# Patient Record
Sex: Female | Born: 1945 | Race: White | Hispanic: No | Marital: Married | Smoking: Former smoker
Health system: Southern US, Community
[De-identification: ages and names within clinical notes are randomized; demographics above are authoritative.]

## PROBLEM LIST (undated history)

## (undated) ENCOUNTER — Ambulatory Visit

## (undated) DIAGNOSIS — J45901 Unspecified asthma with (acute) exacerbation: Secondary | ICD-10-CM

## (undated) DIAGNOSIS — R519 Headache, unspecified: Secondary | ICD-10-CM

## (undated) DIAGNOSIS — J349 Unspecified disorder of nose and nasal sinuses: Secondary | ICD-10-CM

## (undated) DIAGNOSIS — Z972 Presence of dental prosthetic device (complete) (partial): Secondary | ICD-10-CM

## (undated) DIAGNOSIS — I10 Essential (primary) hypertension: Secondary | ICD-10-CM

## (undated) DIAGNOSIS — J449 Chronic obstructive pulmonary disease, unspecified: Secondary | ICD-10-CM

## (undated) DIAGNOSIS — K759 Inflammatory liver disease, unspecified: Secondary | ICD-10-CM

## (undated) DIAGNOSIS — R51 Headache: Secondary | ICD-10-CM

## (undated) HISTORY — PX: TRIGGER FINGER RELEASE: SHX641

## (undated) HISTORY — PX: HEMORRHOID SURGERY: SHX153

## (undated) HISTORY — DX: Headache, unspecified: R51.9

## (undated) HISTORY — DX: Unspecified asthma with (acute) exacerbation: J45.901

## (undated) HISTORY — PX: ABDOMINAL HYSTERECTOMY: SHX81

## (undated) HISTORY — PX: BREAST SURGERY: SHX581

## (undated) HISTORY — PX: BREAST EXCISIONAL BIOPSY: SUR124

## (undated) HISTORY — DX: Unspecified disorder of nose and nasal sinuses: J34.9

## (undated) HISTORY — PX: GALLBLADDER SURGERY: SHX652

## (undated) HISTORY — DX: Headache: R51

---

## 1994-03-11 HISTORY — PX: BREAST BIOPSY: SHX20

## 2005-12-06 ENCOUNTER — Ambulatory Visit: Payer: Self-pay

## 2006-01-09 HISTORY — PX: DEEP NECK LYMPH NODE BIOPSY / EXCISION: SUR126

## 2006-01-23 ENCOUNTER — Other Ambulatory Visit: Payer: Self-pay

## 2006-02-05 ENCOUNTER — Ambulatory Visit: Payer: Self-pay | Admitting: Otolaryngology

## 2009-01-02 ENCOUNTER — Ambulatory Visit: Payer: Self-pay | Admitting: Otolaryngology

## 2009-11-21 ENCOUNTER — Ambulatory Visit: Payer: Self-pay | Admitting: Internal Medicine

## 2010-07-12 ENCOUNTER — Emergency Department: Payer: Self-pay | Admitting: Emergency Medicine

## 2010-12-26 ENCOUNTER — Ambulatory Visit: Payer: Self-pay

## 2011-02-08 ENCOUNTER — Ambulatory Visit: Payer: Self-pay | Admitting: Internal Medicine

## 2012-01-08 ENCOUNTER — Ambulatory Visit: Payer: Self-pay | Admitting: Internal Medicine

## 2013-01-12 ENCOUNTER — Ambulatory Visit: Payer: Self-pay | Admitting: Internal Medicine

## 2013-02-11 ENCOUNTER — Encounter: Payer: Self-pay | Admitting: Podiatry

## 2013-02-12 ENCOUNTER — Ambulatory Visit (INDEPENDENT_AMBULATORY_CARE_PROVIDER_SITE_OTHER): Payer: Medicare Other

## 2013-02-12 ENCOUNTER — Ambulatory Visit (INDEPENDENT_AMBULATORY_CARE_PROVIDER_SITE_OTHER): Payer: Medicare Other | Admitting: Podiatry

## 2013-02-12 ENCOUNTER — Encounter: Payer: Self-pay | Admitting: Podiatry

## 2013-02-12 VITALS — BP 156/83 | HR 62 | Resp 16 | Ht 64.0 in | Wt 198.0 lb

## 2013-02-12 DIAGNOSIS — M79609 Pain in unspecified limb: Secondary | ICD-10-CM

## 2013-02-12 DIAGNOSIS — M79671 Pain in right foot: Secondary | ICD-10-CM

## 2013-02-12 DIAGNOSIS — G589 Mononeuropathy, unspecified: Secondary | ICD-10-CM

## 2013-02-12 DIAGNOSIS — M7750 Other enthesopathy of unspecified foot: Secondary | ICD-10-CM

## 2013-02-12 MED ORDER — TRIAMCINOLONE ACETONIDE 10 MG/ML IJ SUSP
10.0000 mg | Freq: Once | INTRAMUSCULAR | Status: AC
  Administered 2013-02-12: 10 mg

## 2013-02-13 NOTE — Progress Notes (Signed)
Subjective:     Patient ID: Caitlin House, female   DOB: 1945/06/05, 67 y.o.   MRN: [id number]  HPI patient presents with pain along the medial side of the big toe right stating it's no longer the nail but more towards my bunion site that is sore   Review of Systems     Objective:   Physical Exam Neurovascular status intact with discomfort distal to the first metatarsal right on the medial side with what appears to be inflammation and nerve irritation. 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 Today I did a careful injection along the medial side of the right big toe and discussed that we may need to remove this nerve and possible remove bone spur and correct bunion if symptoms do not improve over the next 6 weeks. Reappoint her recheck

## 2013-03-19 ENCOUNTER — Ambulatory Visit: Payer: Medicare Other | Admitting: Podiatry

## 2013-06-22 ENCOUNTER — Ambulatory Visit: Payer: Self-pay | Admitting: Podiatry

## 2013-07-09 ENCOUNTER — Ambulatory Visit (INDEPENDENT_AMBULATORY_CARE_PROVIDER_SITE_OTHER): Payer: Medicare Other | Admitting: Podiatry

## 2013-07-09 VITALS — Resp 16 | Ht 64.0 in | Wt 197.0 lb

## 2013-07-09 DIAGNOSIS — M779 Enthesopathy, unspecified: Secondary | ICD-10-CM

## 2013-07-09 DIAGNOSIS — M7750 Other enthesopathy of unspecified foot: Secondary | ICD-10-CM

## 2013-07-09 MED ORDER — TRIAMCINOLONE ACETONIDE 10 MG/ML IJ SUSP
10.0000 mg | Freq: Once | INTRAMUSCULAR | Status: AC
  Administered 2013-07-09: 10 mg

## 2013-07-11 NOTE — Progress Notes (Signed)
Subjective:     Patient ID: Caitlin MusterElizabeth House, female   DOB: May 29, 1945, 68 y.o.   MRN: [id number]  HPI patient presents stating it is getting tender again in its time for medication. It's been around 6 months   Review of Systems     Objective:   Physical Exam Neurovascular status intact with discomfort in the right sinus tarsi and also inflammation around the side of the right big toe with fluid buildup. Both areas are symptomatic with the ankle being worse    Assessment:     Capsulitis of the right sinus tarsi and tendinitis right foot around the first MPJ and distal    Plan:     Reviewed condition and injected the sinus tarsi right 3 mg Kenalog 5 of them site Marcaine mixture and carefully injected the medial side of the right hallux 3 mg Kenalog 5 mg I can Marcaine mixture reappoint as needed

## 2013-07-28 ENCOUNTER — Ambulatory Visit: Payer: Self-pay | Admitting: Gastroenterology

## 2013-11-12 ENCOUNTER — Encounter: Payer: Self-pay | Admitting: Podiatry

## 2013-11-12 ENCOUNTER — Ambulatory Visit (INDEPENDENT_AMBULATORY_CARE_PROVIDER_SITE_OTHER): Payer: Medicare Other | Admitting: Podiatry

## 2013-11-12 VITALS — BP 124/61 | HR 67 | Resp 16

## 2013-11-12 DIAGNOSIS — M779 Enthesopathy, unspecified: Secondary | ICD-10-CM

## 2013-11-12 DIAGNOSIS — M79609 Pain in unspecified limb: Secondary | ICD-10-CM

## 2013-11-12 DIAGNOSIS — M79671 Pain in right foot: Secondary | ICD-10-CM

## 2013-11-12 MED ORDER — TRIAMCINOLONE ACETONIDE 10 MG/ML IJ SUSP: 10.0000 mg | Freq: Once | INTRAMUSCULAR | Status: DC

## 2013-11-15 NOTE — Progress Notes (Signed)
Subjective:     Patient ID: Caitlin House, female   DOB: [birth year], 68 y.o.   MRN: [id number]  HPI patient states the pain is back in my right ankle and on the medial side of my right big toe joint is starting to become sore again   Review of Systems     Objective:   Physical Exam Neurovascular status is intact with discomfort in the right sinus tarsi and along the medial aspect of the right first MPJ extending to the hallux    Assessment:     Inflammatory capsulitis right along with inflammatory capsulitis right first MPJ    Plan:     Injected the right sinus tarsi 3 mg Kenalog 5 mg Xylocaine and the medial side of the right hallux and first MPJ 3 mg Kenalog 5 mg Xylocaine and instructed on returning when symptoms were to recur

## 2014-01-18 ENCOUNTER — Ambulatory Visit: Payer: Self-pay | Admitting: Internal Medicine

## 2014-02-01 ENCOUNTER — Ambulatory Visit (INDEPENDENT_AMBULATORY_CARE_PROVIDER_SITE_OTHER): Payer: Medicare Other | Admitting: Podiatry

## 2014-02-01 ENCOUNTER — Encounter: Payer: Self-pay | Admitting: Podiatry

## 2014-02-01 VITALS — BP 116/80 | HR 66 | Resp 16

## 2014-02-01 DIAGNOSIS — M779 Enthesopathy, unspecified: Secondary | ICD-10-CM

## 2014-02-01 MED ORDER — TRIAMCINOLONE ACETONIDE 10 MG/ML IJ SUSP
10.0000 mg | Freq: Once | INTRAMUSCULAR | Status: AC
  Administered 2014-02-01: 10 mg

## 2014-02-01 NOTE — Progress Notes (Signed)
Subjective:     Patient ID: Caitlin MusterElizabeth Hildenbrand, female   DOB: [birth year], 68 y.o.   MRN: [id number]  HPI patient states her right ankle has started to hurt her quite a bit and she needs an injection   Review of Systems     Objective:   Physical Exam Neurovascular status intact with sinus tarsi right inflamed but able to walk well with pain just occurring in the last couple weeks    Assessment:     Inflammatory capsulitis right    Plan:     Injected the sinus tarsi 3 mg Kenalog 5 mg Xylocaine and advised on ice therapy and possible orthotic usage for the future depending on how she responds

## 2014-02-21 ENCOUNTER — Ambulatory Visit: Payer: Self-pay | Admitting: Physician Assistant

## 2014-04-26 ENCOUNTER — Ambulatory Visit (INDEPENDENT_AMBULATORY_CARE_PROVIDER_SITE_OTHER): Payer: Medicare Other | Admitting: Podiatry

## 2014-04-26 VITALS — BP 126/65 | HR 66 | Resp 18

## 2014-04-26 DIAGNOSIS — M722 Plantar fascial fibromatosis: Secondary | ICD-10-CM

## 2014-04-27 NOTE — Progress Notes (Signed)
Subjective:     Patient ID: Caitlin House, female   DOB: [birth year], 69 y.o.   MRN: [id number]  HPI patient presents stating she noted a knot in her left arch and it has been mildly tender and she was concerned about   Review of Systems     Objective:   Physical Exam Neurovascular status intact with no other health history changes noted and noted to have a small nodule in the left plantar fashion mid arch area that's moderately tender when pressed and is localized with no proximal edema erythema noted. 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 Also we'll begin home hot compresses therapy and if any growth and size were to occur or changes then we will consider excision or MRI. At this time I offered MRI and she denies wanting it and wants to try this more conservative route

## 2014-06-23 ENCOUNTER — Ambulatory Visit (INDEPENDENT_AMBULATORY_CARE_PROVIDER_SITE_OTHER): Payer: Medicare Other

## 2014-06-23 ENCOUNTER — Ambulatory Visit (INDEPENDENT_AMBULATORY_CARE_PROVIDER_SITE_OTHER): Payer: Medicare Other | Admitting: Podiatry

## 2014-06-23 VITALS — BP 137/78 | HR 74 | Resp 16

## 2014-06-23 DIAGNOSIS — M79671 Pain in right foot: Secondary | ICD-10-CM

## 2014-06-23 DIAGNOSIS — M779 Enthesopathy, unspecified: Secondary | ICD-10-CM

## 2014-06-24 NOTE — Progress Notes (Signed)
Patient ID: Caitlin House, female   DOB: [birth year], 69 y.o.   MRN: [id number]  Subjective: [age]-year-old female presents today with complaints of pain in her right foot. She states that she appears injection this area last year with Dr. Charlsie Merlesegal which helped, and she is inquiring about another injection. She states that she has orthotics at home which she got for plantar fasciitis, but has not been wearing them. No recent injury or trauma. Denies any overlying redness/swelling. No numbness/tingling. No other complaints at this time. Denies any fevers, chills, nausea, vomiting. No acute changes since last appointment.   Objective: AAO x3, NAD DP/PT pulses palpable b/l, CRT < 3 sec Protective sensation intact with Simms Weinstein monofilament, Achilles tendon reflex intact. On the right side there is tenderness palpation upon lateral aspect of the sinus tarsi and there is decreased range of motion of the subtalar joint. There is no pain with range of motion subtalar joint and there is no crepitation. There is no pain with ankle joint range of motion or crepitation range of motion is intact. No other areas of tenderness to bilateral lower extremities. No overlying edema, erythema, increase in warmth. No open lesions or pre-ulcerative lesions. No pain with calf compression, swelling, warmth, erythema.  Assessment: 69 year old female pain along sinus tarsi, capsulitis  Plan: -X-rays were obtained and reviewed with the patient -Treatment options were discussed including alternatives, risks, complications -Patient is requesting steroid injection into the area. Risks and complications the injection were discussed with the patient. Patient verbally consented. Under sterile conditions a total 1.5 mL mixture of 2% lidocaine plain and dexamethasone phosphate was infiltrated into the lateral aspect of the sinus tarsi. Band-Aid was applied. Postinjection care was discussed the patient. -She has orthotics at  home and recommended herself wearing these to help control her foot type. Discussed with her that if she needs adjustment she can bring them to her next appointment. -Follow-up as needed. In the meantime encouraged to call the office with any questions or concerns.

## 2014-10-18 ENCOUNTER — Ambulatory Visit (INDEPENDENT_AMBULATORY_CARE_PROVIDER_SITE_OTHER): Payer: Medicare Other | Admitting: Podiatry

## 2014-10-18 VITALS — BP 121/73 | HR 73 | Resp 16

## 2014-10-18 DIAGNOSIS — M779 Enthesopathy, unspecified: Secondary | ICD-10-CM | POA: Diagnosis not present

## 2014-10-18 DIAGNOSIS — M19071 Primary osteoarthritis, right ankle and foot: Secondary | ICD-10-CM

## 2014-10-19 NOTE — Progress Notes (Signed)
Patient ID: Caitlin House, female   DOB: 12-22-1945, 69 y.o.   MRN: [id number]  Subjective: 69 year old female presents the office they for follow-up evaluation continued pain of right foot pain. She states that she gets relief after the injections which she has had to her sinus tarsi however the pain does recur. She does state the injections help however the pain does come back. She states that he only has pain to the area after standing for long periods of time at work. Other than that she did not have any pain with regular activity. She denies any history of injury or trauma. Denies any swelling or redness. No tenderness. No other complaints at this time in no acute changes since last appointment.  Objective: AAO 3, NAD DP/PT pulses palpable, CRT less than 3 seconds Protective sensation intact with Simms Weinstein monofilament Subjectively there is tenderness to palpation on the lateral aspect of the sinus tarsi when she does have pain however she doesn't have pain at this time. There is mild decrease in range of motion of subtalar joint without any crepitation. There is no pain with range of motion however. There is no pain with ankle joint range of motion. There is no area of pinpoint tenderness or pain the vibratory sensation. There is no overlying edema, erythema, increase in warmth. No open lesions or pre-ulcer lesions. No pain with calf compression, swelling, warmth, erythema.  Assessment: 69 year old female with continue sinus tarsi pain with activity  Plan: -Treatment options discussed including all alternatives, risks, and complications -I do that she would likely benefit from an AFO to help stabilize her ankle/subtalar joint. Rx AFO for [facility].  -Follow-up after AFO or sooner if any problems arise. In the meantime, encouraged to call the office with any questions, concerns, change in symptoms.  -Will hold off on another injection at this time as it is not that  painful.  Ovid Curd, DPM

## 2014-10-20 ENCOUNTER — Ambulatory Visit: Payer: Medicare Other

## 2014-11-17 ENCOUNTER — Ambulatory Visit: Payer: Medicare Other

## 2014-11-17 DIAGNOSIS — M25571 Pain in right ankle and joints of right foot: Secondary | ICD-10-CM | POA: Diagnosis not present

## 2014-11-17 DIAGNOSIS — M19072 Primary osteoarthritis, left ankle and foot: Secondary | ICD-10-CM | POA: Diagnosis not present

## 2014-11-22 ENCOUNTER — Other Ambulatory Visit: Payer: Self-pay | Admitting: Internal Medicine

## 2014-11-22 DIAGNOSIS — Z1231 Encounter for screening mammogram for malignant neoplasm of breast: Secondary | ICD-10-CM

## 2014-12-29 ENCOUNTER — Encounter: Payer: Self-pay | Admitting: Podiatry

## 2014-12-29 ENCOUNTER — Ambulatory Visit (INDEPENDENT_AMBULATORY_CARE_PROVIDER_SITE_OTHER): Payer: Medicare Other

## 2014-12-29 ENCOUNTER — Ambulatory Visit (INDEPENDENT_AMBULATORY_CARE_PROVIDER_SITE_OTHER): Payer: Medicare Other | Admitting: Podiatry

## 2014-12-29 VITALS — BP 174/89 | HR 72 | Resp 18

## 2014-12-29 DIAGNOSIS — R52 Pain, unspecified: Secondary | ICD-10-CM

## 2014-12-29 DIAGNOSIS — M19071 Primary osteoarthritis, right ankle and foot: Secondary | ICD-10-CM | POA: Diagnosis not present

## 2014-12-29 DIAGNOSIS — M795 Residual foreign body in soft tissue: Secondary | ICD-10-CM | POA: Diagnosis not present

## 2014-12-29 NOTE — Progress Notes (Signed)
Patient ID: Caitlin House, female   DOB: 13-Nov-1945, 69 y.o.   MRN: [id number]  Subjective: Patient presents the office concerns of possible glass the bottom of her right foot. She states that last weekend a glass door broke and she later stepped on something in the kitchen for which she thinks may be glass. They have tried to take the area himself call however were unsuccessful she continues to have pain to her right foot. She denies any surrounding redness or drainage from along the puncture site. Denies any swelling. She has a states that she has been wearing the braces last appointment although it did wear out her shoes that she was wearing. She states the brace is comfortable when she was wearing it however she is also fitted into her current shoes at this time. She is when wearing the ankle brace which seems to help as well. No other complaints at this time in no acute changes.  Objective: AAO 3, NAD DP/PT pulses 2/4, CRT less than 3 seconds Protective sensation intact with Simms Weinstein monofilament At this time there is no specific tenderness along the lateral aspect of the sinus tarsi. There is a decreased range of motion of the subtalar joint without any crepitation. No pain with ankle joint range of motion.  On the right foot submetatarsal one that does appear to be a small hyperkeratotic lesion with what appears to be puncture site within the central aspect and some dried blood. Upon debridement of the hyperkeratotic tissue a small piece of glass was removed from the patient. Upon removal of the glass do not appear to be any other foreign object identified. There is no swelling erythema, ascending cellulitis, fluctuance, crepitus, malodor, drainage/purulence. There is no other areas of tenderness to bilateral lower extremity is. No other open lesions or pre-ulcerative lesions. No pain with calf compression, swelling, warmth, erythema.  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  -Hyperkeratotic lesion on the right foot was debrided in a small foreign body which is consistent with glass was removed. And about equinus place over the area after the area was cleansed. Recommended he continue daily dressing changes. The areas nonhealing the next 1-2 weeks to call the office or sooner if any problems are to arise. Monitor for any clinical signs or symptoms of infection and directed to call the office immediately should any occur or go to the ER. -Continue with bracing. -Follow-up next 2 weeks of the areas not healed at there is continued pain. In the meantime call the office with any questions, concerns, change in symptoms.  Ovid CurdMatthew Otila Starn, DPM

## 2015-01-24 ENCOUNTER — Ambulatory Visit
Admission: RE | Admit: 2015-01-24 | Discharge: 2015-01-24 | Disposition: A | Discharge status: Home / Self Care | Payer: Medicare Other | Source: Ambulatory Visit | Attending: Internal Medicine | Admitting: Internal Medicine

## 2015-01-24 DIAGNOSIS — Z1231 Encounter for screening mammogram for malignant neoplasm of breast: Secondary | ICD-10-CM | POA: Insufficient documentation

## 2015-06-24 ENCOUNTER — Encounter: Payer: Self-pay | Admitting: Emergency Medicine

## 2015-06-24 ENCOUNTER — Ambulatory Visit
Admission: EM | Admit: 2015-06-24 | Discharge: 2015-06-24 | Disposition: A | Discharge status: Home / Self Care | Payer: Medicare Other | Attending: Family Medicine | Admitting: Family Medicine

## 2015-06-24 DIAGNOSIS — S39012A Strain of muscle, fascia and tendon of lower back, initial encounter: Secondary | ICD-10-CM | POA: Diagnosis not present

## 2015-06-24 MED ORDER — CYCLOBENZAPRINE HCL 5 MG PO TABS: 5.0000 mg | ORAL_TABLET | Freq: Every day | ORAL | Status: DC

## 2015-06-24 MED ORDER — NAPROXEN 500 MG PO TABS: 500.0000 mg | ORAL_TABLET | Freq: Two times a day (BID) | ORAL | Status: DC

## 2015-06-24 NOTE — ED Notes (Signed)
[place] back catch.  It has gotten worse today

## 2015-06-24 NOTE — ED Provider Notes (Signed)
CSN: [id number]     Arrival date & time 06/24/15  1342 History   First MD Initiated Contact with Patient 06/24/15 1444     Chief Complaint  Patient presents with  . Back Pain   (Consider location/radiation/quality/duration/timing/severity/associated sxs/prior Treatment) HPI: Patient presents today with symptoms of lower back pain. Patient states that she was walking getting into [employer] when she stepped funny and felt her back started to hurt. Patient denies any chronic issues with her back. She denies any radicular symptoms, incontinence, fever, chills, urinary symptoms, foot drop.  Past Medical History  Diagnosis Date  . Sinus problem   . Asthma flare   . Frequent headaches    Past Surgical History  Procedure Laterality Date  . Abdominal hysterectomy    . Gallbladder surgery    . Hemorrhoid surgery    . Breast surgery Right   . Trigger finger release Right   . Breast biopsy Right 1996    neg   Family History  Problem Relation Age of Onset  . Cancer Father   . Breast cancer Maternal Aunt    Social History  Substance Use Topics  . Smoking status: Former Games developermoker  . Smokeless tobacco: Never Used  . Alcohol Use: Yes     Comment: rarely   OB History    No data available     Review of Systems: Negative except mentioned above.   Allergies  Sulfa antibiotics  Home Medications   Prior to Admission medications   Medication Sig Start Date End Date Taking? Authorizing Provider  atenolol (TENORMIN) 100 MG tablet Take 100 mg by mouth daily.   Yes Historical Provider, MD  butalbital-aspirin-caffeine [facility](FIORINAL) 50-325-40 MG tablet Take 1 tablet by mouth 2 (two) times daily as needed for headache.   Yes Historical Provider, MD  losartan (COZAAR) 50 MG tablet Take 50 mg by mouth daily.   Yes Historical Provider, MD  mometasone (NASONEX) 50 MCG/ACT nasal spray Place 2 sprays into the nose daily.   Yes Historical Provider, MD  pravastatin (PRAVACHOL) 20 MG tablet Take 20 mg by  mouth daily.   Yes Historical Provider, MD  bisoprolol-hydrochlorothiazide (ZIAC) 5-6.25 MG per tablet Take 1 tablet by mouth daily.    Historical Provider, MD  cyclobenzaprine (FLEXERIL) 5 MG tablet Take 1 tablet (5 mg total) by mouth at bedtime. Can cause drowsiness. 06/24/15   Jolene ProvostKirtida Shalinda Burkholder, MD  Fluticasone-Salmeterol (ADVAIR) 100-50 MCG/DOSE AEPB Inhale 1 puff into the lungs 2 (two) times daily.    Historical Provider, MD  hydrochlorothiazide (HYDRODIURIL) 25 MG tablet Take 25 mg by mouth daily.    Historical Provider, MD  naproxen (NAPROSYN) 500 MG tablet Take 1 tablet (500 mg total) by mouth 2 (two) times daily. Take with food. 06/24/15   Jolene ProvostKirtida Elmar [place], MD   Meds Ordered and Administered this Visit  Medications - No data to display  BP 126/68 mmHg  Pulse 73  Temp(Src) 97.9 F (36.6 C) (Tympanic)  Resp 18  Ht 5\' 2"  (1.575 m)  Wt 191 lb (86.637 kg)  BMI 34.93 kg/m2  SpO2 99% No data found.   Physical Exam  GENERAL: NAD HEENT: no pharyngeal erythema, no exudate, no erythema of TMs, no cervical LAD RESP: CTA B CARD: RRR MSK: no midline tenderness, generalized lower back tenderness bilaterally L>R, FROM, -SLR, 5/5 strength of LEs, nv intact  NEURO: CN II-XII grossly intact   ED Course  Procedures (including critical care time)  Labs Review Labs Reviewed - No data to display  Imaging Review No results found.    MDM   1. Back strain, initial encounter   Will treat patient with Naproxen and Flexeril, ice/heat when necessary, if symptoms do persist or worsen I do recommend that patient have imaging of her back. Patient addresses understanding of plan and will seek medical attention if needed.    Jolene Provost, MD 06/24/15 [phone]

## 2015-12-25 ENCOUNTER — Other Ambulatory Visit: Payer: Self-pay | Admitting: Internal Medicine

## 2015-12-25 DIAGNOSIS — Z1231 Encounter for screening mammogram for malignant neoplasm of breast: Secondary | ICD-10-CM

## 2016-01-29 ENCOUNTER — Ambulatory Visit
Admission: RE | Admit: 2016-01-29 | Discharge: 2016-01-29 | Disposition: A | Discharge status: Home / Self Care | Payer: Medicare Other | Source: Ambulatory Visit | Attending: Internal Medicine | Admitting: Internal Medicine

## 2016-01-29 DIAGNOSIS — Z1231 Encounter for screening mammogram for malignant neoplasm of breast: Secondary | ICD-10-CM | POA: Diagnosis not present

## 2016-04-12 IMAGING — MG MM DIGITAL SCREENING BILAT W/ CAD
1 series · 4 of 4 positions shown · non-contrast
Comparison: Previous exam(s).

CLINICAL DATA: Screening. Prior benign right breast biopsy in 6009.
Family history of breast cancer in an aunt

EXAM:
DIGITAL SCREENING BILATERAL MAMMOGRAM WITH CAD

[R CC · right · 4 of 4 slices shown]
[im 1/4]
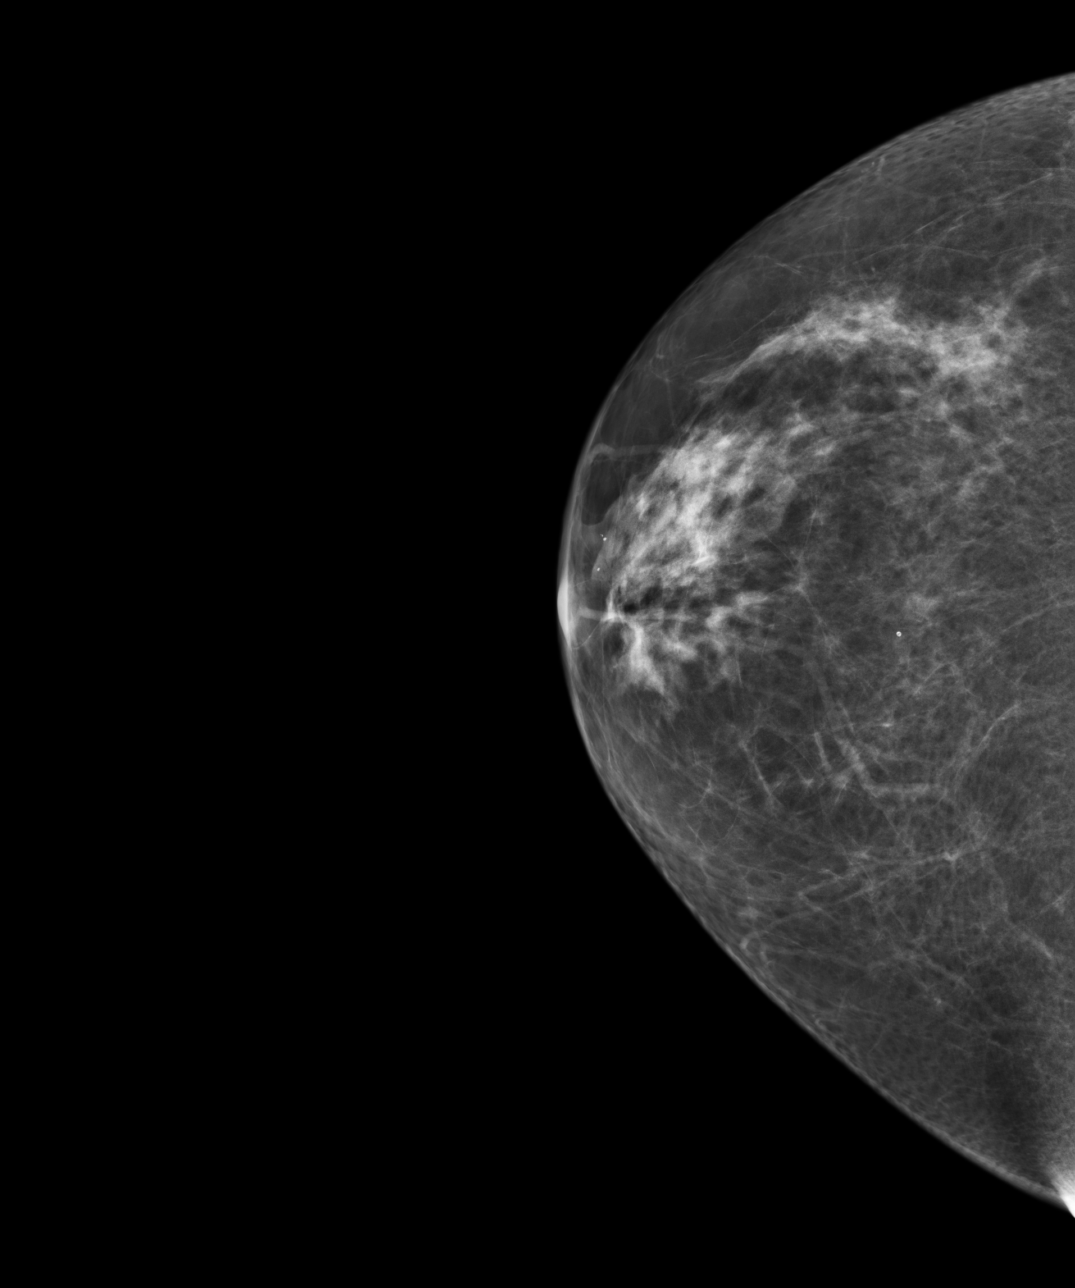
[im 2/4]
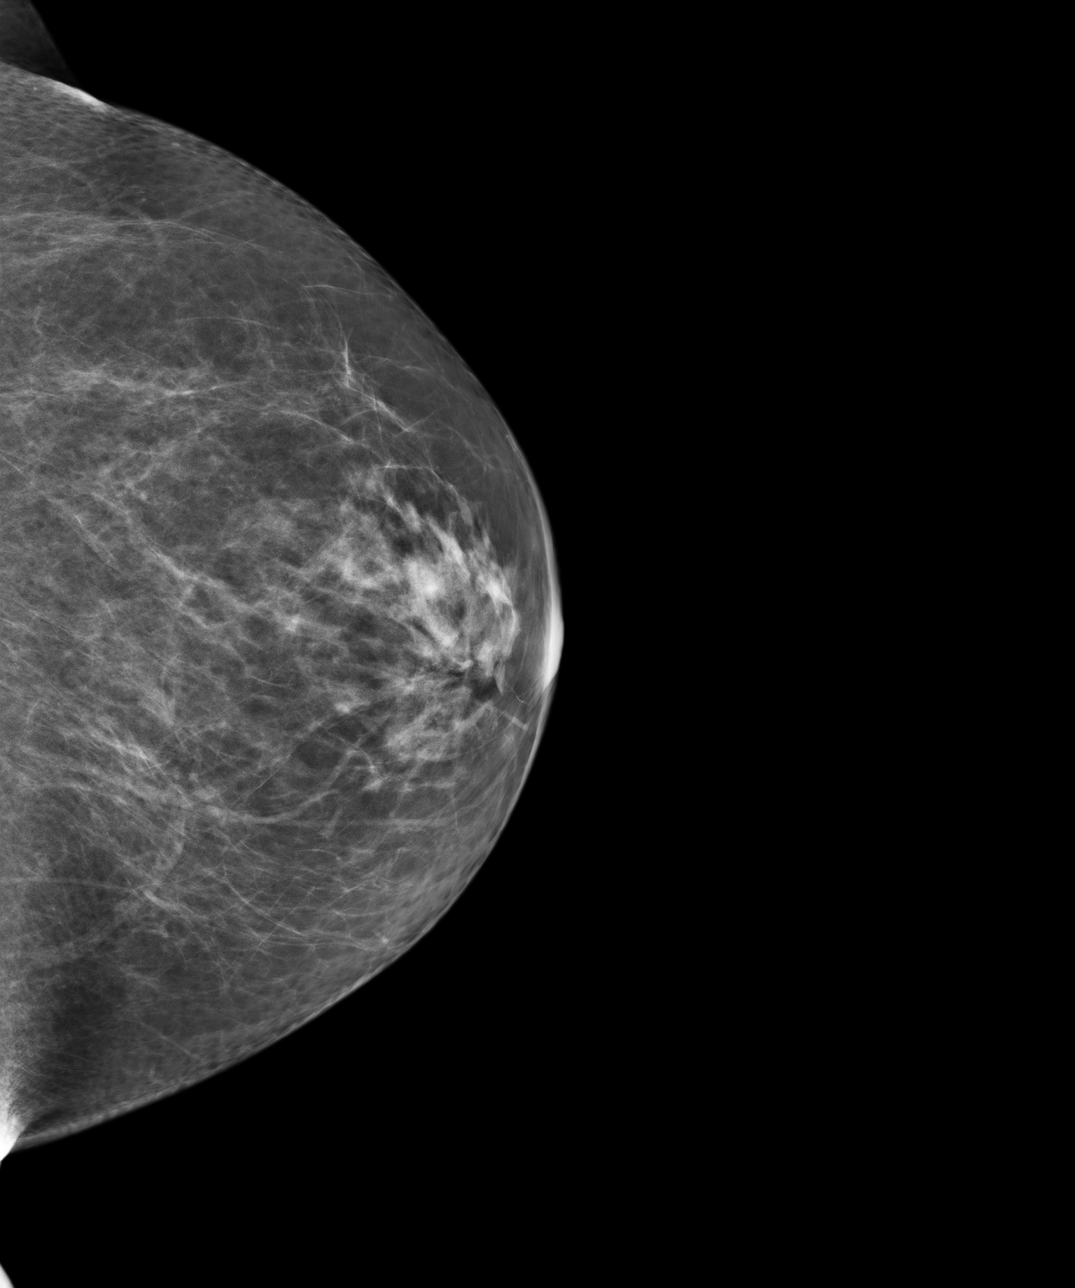
[im 3/4]
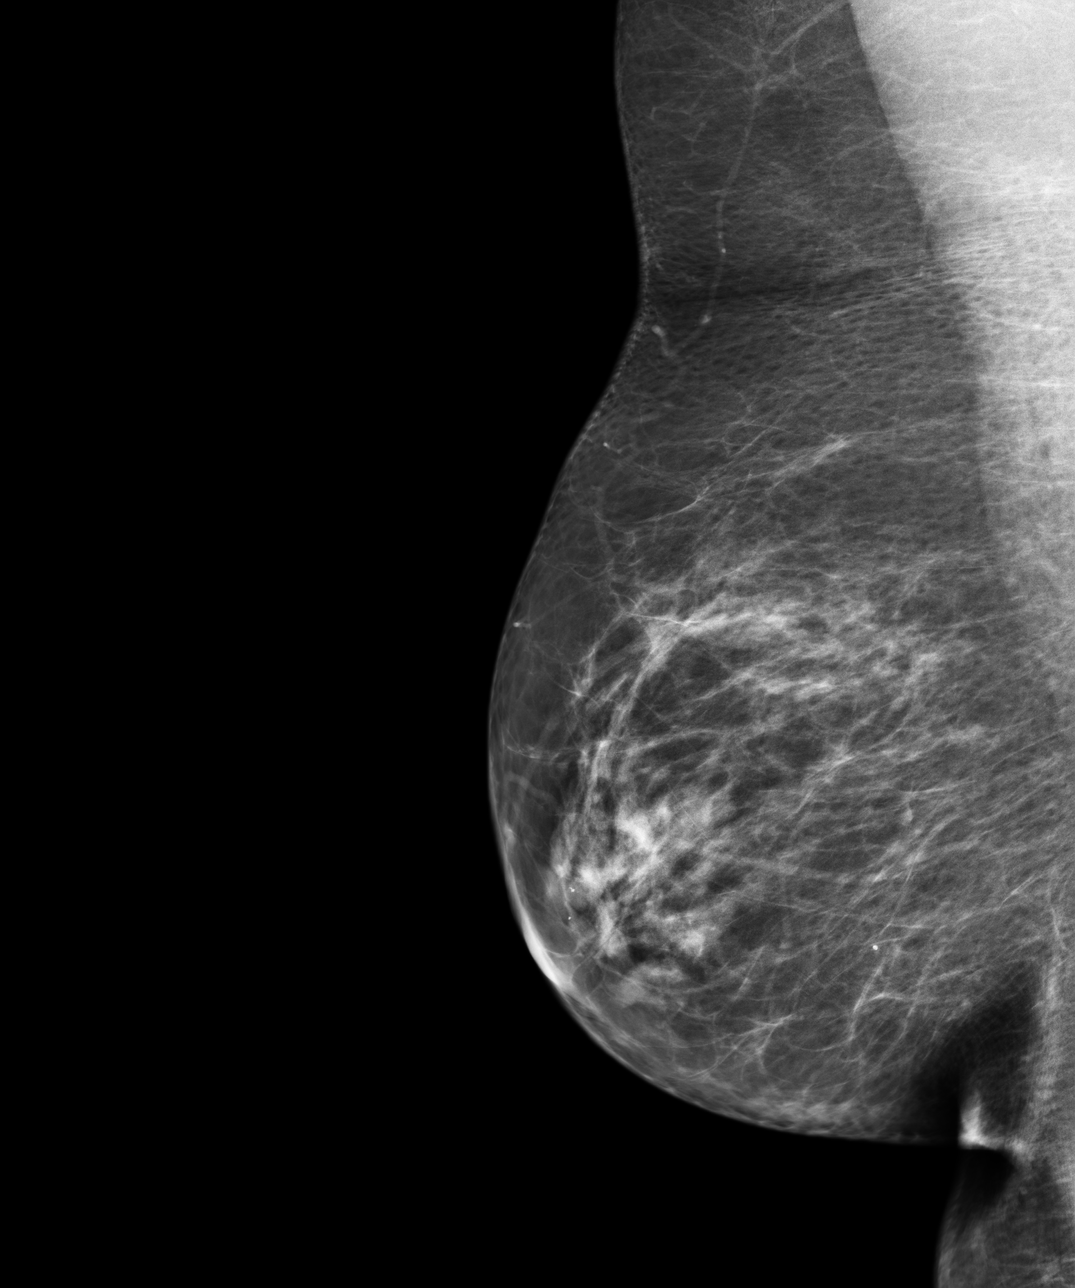
[im 4/4]
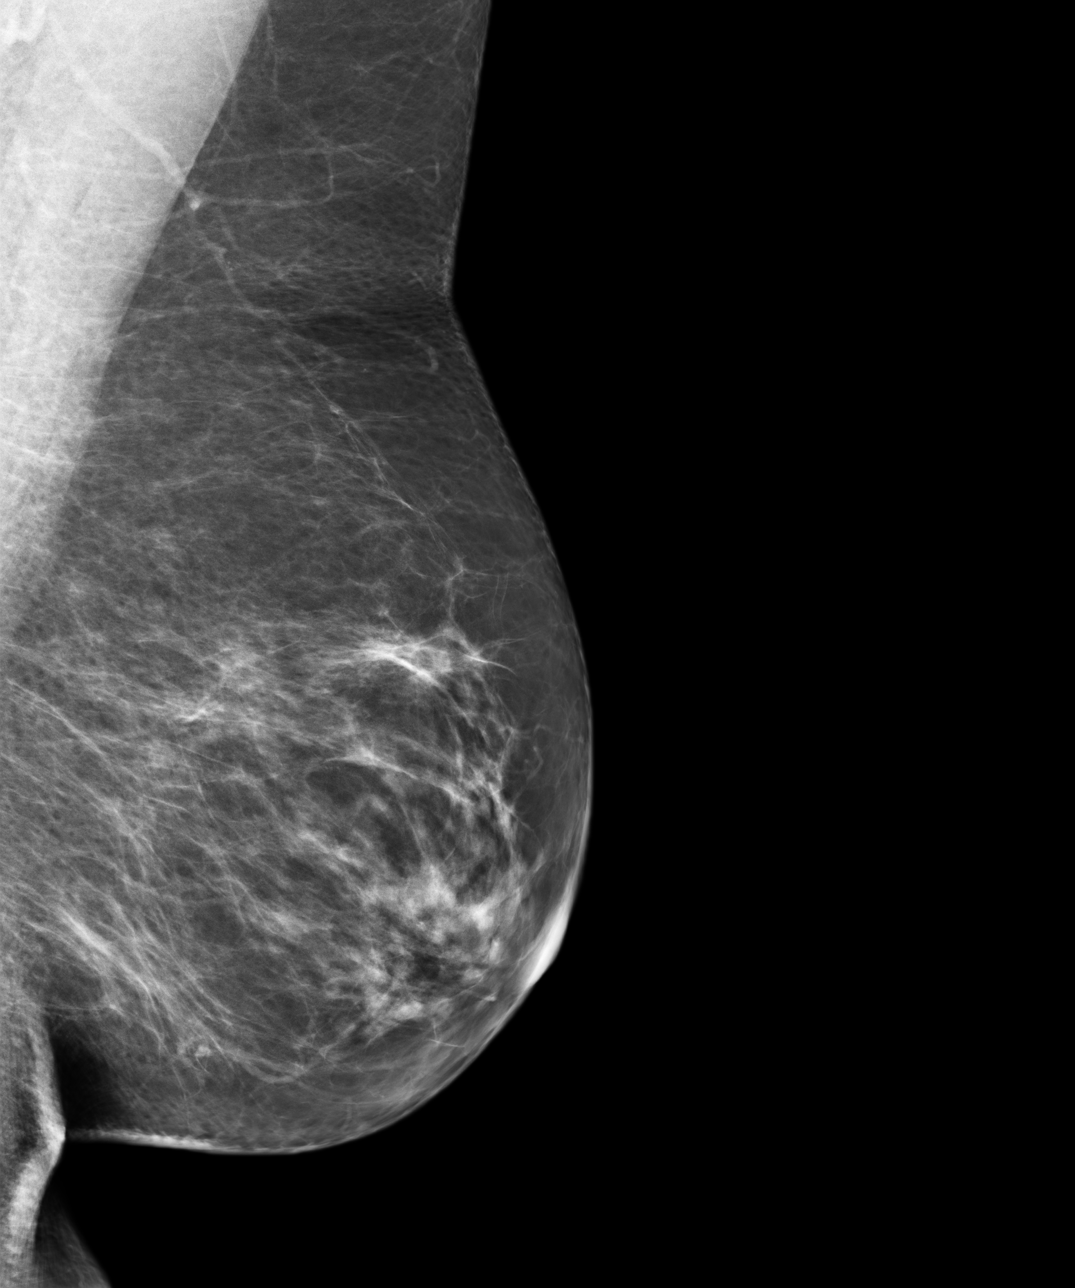

[4 of 4 positions shown; findings below may reference images not displayed]

ACR Breast Density Category b: There are scattered areas of
fibroglandular density.
FINDINGS: There are no findings suspicious for malignancy. Images were
processed with CAD.
IMPRESSION: No mammographic evidence of malignancy. A result letter of this
screening mammogram will be mailed directly to the patient.

RECOMMENDATION:
Screening mammogram in one year. (Code:9F-2-2VH)

BI-RADS CATEGORY  1: Negative.

## 2016-12-30 ENCOUNTER — Other Ambulatory Visit: Payer: Self-pay | Admitting: Internal Medicine

## 2016-12-30 DIAGNOSIS — Z1231 Encounter for screening mammogram for malignant neoplasm of breast: Secondary | ICD-10-CM

## 2017-01-29 ENCOUNTER — Ambulatory Visit: Payer: Medicare Other

## 2017-01-29 ENCOUNTER — Ambulatory Visit
Admission: RE | Admit: 2017-01-29 | Discharge: 2017-01-29 | Disposition: A | Discharge status: Home / Self Care | Payer: Medicare Other | Source: Ambulatory Visit | Attending: Internal Medicine | Admitting: Internal Medicine

## 2017-01-29 DIAGNOSIS — Z1231 Encounter for screening mammogram for malignant neoplasm of breast: Secondary | ICD-10-CM

## 2017-03-18 ENCOUNTER — Ambulatory Visit (INDEPENDENT_AMBULATORY_CARE_PROVIDER_SITE_OTHER): Payer: Medicare Other

## 2017-03-18 ENCOUNTER — Ambulatory Visit: Payer: Medicare Other | Admitting: Podiatry

## 2017-03-18 ENCOUNTER — Other Ambulatory Visit: Payer: Self-pay | Admitting: Podiatry

## 2017-03-18 ENCOUNTER — Encounter: Payer: Self-pay | Admitting: Podiatry

## 2017-03-18 DIAGNOSIS — M7750 Other enthesopathy of unspecified foot: Secondary | ICD-10-CM

## 2017-03-18 DIAGNOSIS — M659 Synovitis and tenosynovitis, unspecified: Secondary | ICD-10-CM

## 2017-03-20 NOTE — Progress Notes (Signed)
   Subjective:  Patient presents today for sharp, shooting pain and tenderness to the right ankle that began 5 days ago. She states the pain began on the medial side but has now moved laterally. Certain movements and walking increases the pain. There are no alleviating factors noted. She has been wearing insoles with no significant relief. Patient presents for further treatment and evaluation.   Past Medical History:  Diagnosis Date  . Asthma flare   . Frequent headaches   . Sinus problem       Objective / Physical Exam:  General:  The patient is alert and oriented x3 in no acute distress. Dermatology:  Skin is warm, dry and supple bilateral lower extremities. Negative for open lesions or macerations. Vascular:  Palpable pedal pulses bilaterally. No edema or erythema noted. Capillary refill within normal limits. Neurological:  Epicritic and protective threshold grossly intact bilaterally.  Musculoskeletal Exam:  Pain on palpation to the anterior lateral medial aspects of the patient's right ankle. Mild edema noted.  Range of motion within normal limits to all pedal and ankle joints bilateral. Muscle strength 5/5 in all groups bilateral.   Radiographic Exam:  Normal osseous mineralization. Joint spaces preserved. No fracture/dislocation/boney destruction.    Assessment: #1 pain in right ankle #2 synovitis of right ankle   Plan of Care:  #1 Patient was evaluated. X-Rays reviewed.  #2 injection of 0.5 mL Celestone Soluspan injected in the patient's right ankle. #3 Compression anklet dispensed.   #4 Continue taking OTC Ibuprofen as needed.  #5 Recommended good shoe gear.  #6 Return to clinic in 4 weeks.   Felecia ShellingBrent M. Shakeera Rightmyer, DPM [facility]  Dr. Felecia ShellingBrent M. Kirra Verga, DPM    [address]                                        [place], [place] [address]                Office [phone]  Fax [phone]

## 2017-03-22 ENCOUNTER — Emergency Department
Admission: EM | Admit: 2017-03-22 | Discharge: 2017-03-22 | Disposition: A | Discharge status: Home / Self Care | Payer: Medicare Other | Attending: Emergency Medicine | Admitting: Emergency Medicine

## 2017-03-22 ENCOUNTER — Emergency Department: Payer: Medicare Other

## 2017-03-22 ENCOUNTER — Encounter: Payer: Self-pay | Admitting: Emergency Medicine

## 2017-03-22 ENCOUNTER — Other Ambulatory Visit: Payer: Self-pay

## 2017-03-22 DIAGNOSIS — I10 Essential (primary) hypertension: Secondary | ICD-10-CM | POA: Diagnosis not present

## 2017-03-22 DIAGNOSIS — Z79899 Other long term (current) drug therapy: Secondary | ICD-10-CM | POA: Insufficient documentation

## 2017-03-22 DIAGNOSIS — J441 Chronic obstructive pulmonary disease with (acute) exacerbation: Secondary | ICD-10-CM

## 2017-03-22 DIAGNOSIS — J449 Chronic obstructive pulmonary disease, unspecified: Secondary | ICD-10-CM | POA: Insufficient documentation

## 2017-03-22 DIAGNOSIS — J40 Bronchitis, not specified as acute or chronic: Secondary | ICD-10-CM | POA: Diagnosis not present

## 2017-03-22 DIAGNOSIS — E876 Hypokalemia: Secondary | ICD-10-CM

## 2017-03-22 DIAGNOSIS — E860 Dehydration: Secondary | ICD-10-CM | POA: Diagnosis not present

## 2017-03-22 DIAGNOSIS — R0602 Shortness of breath: Secondary | ICD-10-CM | POA: Diagnosis present

## 2017-03-22 DIAGNOSIS — Z87891 Personal history of nicotine dependence: Secondary | ICD-10-CM | POA: Diagnosis not present

## 2017-03-22 HISTORY — DX: Essential (primary) hypertension: I10

## 2017-03-22 HISTORY — DX: Chronic obstructive pulmonary disease, unspecified: J44.9

## 2017-03-22 LAB — CBC WITH DIFFERENTIAL/PLATELET
Basophils Absolute: 0 10*3/uL (ref 0–0.1)
Basophils Relative: 0 %
EOS ABS: 0 10*3/uL (ref 0–0.7)
EOS PCT: 0 %
HCT: 39.6 % (ref 35.0–47.0)
Hemoglobin: 13.3 g/dL (ref 12.0–16.0)
LYMPHS ABS: 1.2 10*3/uL (ref 1.0–3.6)
Lymphocytes Relative: 15 %
MCH: 31.1 pg (ref 26.0–34.0)
MCHC: 33.5 g/dL (ref 32.0–36.0)
MCV: 92.9 fL (ref 80.0–100.0)
MONO ABS: 0.9 10*3/uL (ref 0.2–0.9)
MONOS PCT: 12 %
Neutro Abs: 5.5 10*3/uL (ref 1.4–6.5)
Neutrophils Relative %: 73 %
PLATELETS: 184 10*3/uL (ref 150–440)
RBC: 4.27 MIL/uL (ref 3.80–5.20)
RDW: 12.6 % (ref 11.5–14.5)
WBC: 7.6 10*3/uL (ref 3.6–11.0)

## 2017-03-22 LAB — BASIC METABOLIC PANEL
Anion gap: 11 (ref 5–15)
BUN: 24 mg/dL — AB (ref 6–20)
CO2: 25 mmol/L (ref 22–32)
CREATININE: 1.18 mg/dL — AB (ref 0.44–1.00)
Calcium: 9 mg/dL (ref 8.9–10.3)
Chloride: 102 mmol/L (ref 101–111)
GFR calc Af Amer: 53 mL/min — ABNORMAL LOW (ref >60)
GFR, EST NON AFRICAN AMERICAN: 46 mL/min — AB (ref >60)
Glucose, Bld: 98 mg/dL (ref 65–99)
Potassium: 3.3 mmol/L — ABNORMAL LOW (ref 3.5–5.1)
SODIUM: 138 mmol/L (ref 135–145)

## 2017-03-22 LAB — TROPONIN I

## 2017-03-22 MED ORDER — PREDNISONE 20 MG PO TABS: ORAL_TABLET | ORAL | 0 refills | Status: DC

## 2017-03-22 MED ORDER — ALBUTEROL SULFATE (2.5 MG/3ML) 0.083% IN NEBU
5.0000 mg | INHALATION_SOLUTION | Freq: Once | RESPIRATORY_TRACT | Status: AC
  Administered 2017-03-22: 5 mg via RESPIRATORY_TRACT
  Filled 2017-03-22: qty 6

## 2017-03-22 MED ORDER — HYDROCOD POLST-CPM POLST ER 10-8 MG/5ML PO SUER: 5.0000 mL | Freq: Two times a day (BID) | ORAL | 0 refills | Status: DC

## 2017-03-22 MED ORDER — SODIUM CHLORIDE 0.9 % IV BOLUS (SEPSIS)
1000.0000 mL | Freq: Once | INTRAVENOUS | Status: AC
  Administered 2017-03-22: 1000 mL via INTRAVENOUS

## 2017-03-22 MED ORDER — HYDROCOD POLST-CPM POLST ER 10-8 MG/5ML PO SUER
5.0000 mL | Freq: Once | ORAL | Status: AC
  Administered 2017-03-22: 5 mL via ORAL
  Filled 2017-03-22: qty 5

## 2017-03-22 MED ORDER — POTASSIUM CHLORIDE CRYS ER 20 MEQ PO TBCR
40.0000 meq | EXTENDED_RELEASE_TABLET | Freq: Once | ORAL | Status: AC
  Administered 2017-03-22: 40 meq via ORAL
  Filled 2017-03-22: qty 2

## 2017-03-22 MED ORDER — PREDNISONE 20 MG PO TABS
60.0000 mg | ORAL_TABLET | Freq: Once | ORAL | Status: AC
  Administered 2017-03-22: 60 mg via ORAL
  Filled 2017-03-22: qty 3

## 2017-03-22 NOTE — ED Triage Notes (Signed)
[facility] Pt reports hx of COPD and Asthma. Pt is ambulatory to triage with NAD.

## 2017-03-22 NOTE — ED Provider Notes (Signed)
[facility] Emergency Department Provider Note   ____________________________________________   First MD Initiated Contact with Patient 03/22/17 0231     (approximate)  I have reviewed the triage vital signs and the nursing notes.   HISTORY  Chief Complaint Shortness of Breath    HPI Caitlin House is a 72 y.o. female who presents to the ED from home with a chief complaint of cough and shortness of breath.  Patient reports onset of symptoms yesterday.  Complains of nonproductive cough, generalized malaise, wheezing with progressive shortness of breath.  Denies associated fever, chills, chest pain, abdominal pain, nausea, vomiting, diarrhea.  Denies recent travel or trauma.   Past Medical History:  Diagnosis Date  . Asthma flare   . COPD (chronic obstructive pulmonary disease) (HCC)   . Frequent headaches   . Hypertension   . Sinus problem     There are no active problems to display for this patient.   Past Surgical History:  Procedure Laterality Date  . ABDOMINAL HYSTERECTOMY    . BREAST BIOPSY Right 1996   neg  . BREAST SURGERY Right   . GALLBLADDER SURGERY    . HEMORRHOID SURGERY    . TRIGGER FINGER RELEASE Right     Prior to Admission medications   Medication Sig Start Date End Date Taking? Authorizing Provider  albuterol (PROVENTIL HFA) 108 (90 Base) MCG/ACT inhaler Inhale into the lungs. 01/19/16   [provider]  atenolol (TENORMIN) 100 MG tablet Take 100 mg by mouth daily.    [provider]  atenolol-chlorthalidone (TENORETIC) 50-25 MG tablet Take by mouth. 05/14/16   [provider]  bisoprolol-hydrochlorothiazide (ZIAC) 5-6.25 MG per tablet Take 1 tablet by mouth daily.    [provider]  butalbital-aspirin-caffeine Benny Lennert) 50-325-40 MG capsule Take by mouth. 05/13/16   [provider]  Cholecalciferol (VITAMIN D3) 2000 units capsule Take by mouth.    [provider]  cyclobenzaprine (FLEXERIL) 5 MG tablet Take 1 tablet (5 mg total) by mouth at bedtime. Can cause drowsiness. 06/24/15   Jolene Provost, MD  Fluticasone-Salmeterol (ADVAIR) 100-50 MCG/DOSE AEPB Inhale 1 puff into the lungs 2 (two) times daily.    [provider]  hydrochlorothiazide (HYDRODIURIL) 25 MG tablet Take 25 mg by mouth daily.    [provider]  losartan (COZAAR) 50 MG tablet Take by mouth. 10/28/16   [provider]  mometasone (NASONEX) 50 MCG/ACT nasal spray Place into the nose.    [provider]  naproxen (NAPROSYN) 500 MG tablet Take 1 tablet (500 mg total) by mouth 2 (two) times daily. Take with food. 06/24/15   Jolene Provost, MD  pravastatin (PRAVACHOL) 20 MG tablet Take by mouth. 08/27/16   [provider]  vitamin B-12 (CYANOCOBALAMIN) 1000 MCG tablet Take by mouth.    [provider]    Allergies Venlafaxine; Sulfa antibiotics; and Lisinopril  Family History  Problem Relation Age of Onset  . Cancer Father   . Breast cancer Maternal Aunt     Social History Social History   Tobacco Use  . Smoking status: Former Games developer  . Smokeless tobacco: Never Used  Substance Use Topics  . Alcohol use: Yes    Comment: rarely  . Drug use: No    Review of Systems  Constitutional: No fever/chills. Eyes: No visual changes. ENT: No sore throat. Cardiovascular: Denies chest pain. Respiratory: Positive for cough, wheezing and shortness of breath. Gastrointestinal: No abdominal pain.  No nausea, no  vomiting.  No diarrhea.  No constipation. Genitourinary: Negative for dysuria. Musculoskeletal: Negative for back pain. Skin: Negative for rash. Neurological: Negative for headaches, focal weakness or numbness.   ____________________________________________   PHYSICAL EXAM:  VITAL SIGNS: ED Triage Vitals  Enc Vitals Group     BP 03/22/17 0041 (!) 181/73     Pulse Rate 03/22/17 0041 82     Resp 03/22/17 0041 18      Temp 03/22/17 0041 99.3 F (37.4 C)     Temp Source 03/22/17 0041 Oral     SpO2 03/22/17 0041 97 %     Weight 03/22/17 0041 200 lb (90.7 kg)     Height 03/22/17 0041 5\' 4"  (1.626 m)     Head Circumference --      Peak Flow --      Pain Score 03/22/17 0039 0     Pain Loc --      Pain Edu? --      Excl. in GC? --     Constitutional: Alert and oriented. Well appearing and in no acute distress. Eyes: Conjunctivae are normal. PERRL. EOMI. Head: Atraumatic. Nose: No congestion/rhinnorhea. Mouth/Throat: Mucous membranes are moist.  Oropharynx non-erythematous. Neck: No stridor.   Cardiovascular: Normal rate, regular rhythm. Grossly normal heart sounds.  Good peripheral circulation. Respiratory: Normal respiratory effort.  No retractions. Lungs with slight wheeze left lower lobe. Gastrointestinal: Soft and nontender. No distention. No abdominal bruits. No CVA tenderness. Musculoskeletal: No lower extremity tenderness nor edema.  No joint effusions. Neurologic:  Normal speech and language. No gross focal neurologic deficits are appreciated. No gait instability. Skin:  Skin is warm, dry and intact. No rash noted. Psychiatric: Mood and affect are normal. Speech and behavior are normal.  ____________________________________________   LABS (all labs ordered are listed, but only abnormal results are displayed)  Labs Reviewed  CBC WITH DIFFERENTIAL/PLATELET  BASIC METABOLIC PANEL  TROPONIN I   ____________________________________________  EKG  ED ECG REPORT I, SUNG,JADE J, the attending physician, personally viewed and interpreted this ECG.   Date: 03/22/2017  EKG Time: 0049  Rate: 84  Rhythm: normal EKG, normal sinus rhythm  Axis: Within normal limits  Intervals:none  ST&T Change: Nonspecific  ____________________________________________  RADIOLOGY  Dg Chest 2 View  Result Date: 03/22/2017 CLINICAL DATA:  Shortness of breath EXAM: CHEST  2 VIEW COMPARISON:  02/21/2014  FINDINGS: Mild bronchitic changes. No consolidation or effusion. Normal heart size. Aortic atherosclerosis. No pneumothorax. Mild degenerative changes of the spine. Surgical clips in the right upper quadrant IMPRESSION: Mild bronchitic changes.  No focal pulmonary infiltrate. Electronically Signed   By: Jasmine Pang M.D.   On: 03/22/2017 01:21    ____________________________________________   PROCEDURES  Procedure(s) performed: None  Procedures  Critical Care performed: No  ____________________________________________   INITIAL IMPRESSION / ASSESSMENT AND PLAN / ED COURSE  As part of my medical decision making, I reviewed the following data within the electronic MEDICAL RECORD NUMBER History obtained from family, Nursing notes reviewed and incorporated, Labs reviewed, EKG interpreted, Old chart reviewed, Radiograph reviewed and Notes from prior ED visits.   72 year old female with COPD who presents with cough and difficulty breathing. Differential includes, but is not limited to, viral syndrome, bronchitis including COPD exacerbation, pneumonia, reactive airway disease including asthma, CHF including exacerbation with or without pulmonary/interstitial edema, pneumothorax, ACS, thoracic trauma, and pulmonary embolism.  Patient states she is feeling much better after albuterol nebulizer treatment administered in triage.  Tells me her PCP called  in prednisone 10 mg daily x 7 days and cephalexin.  She becomes mildly tachycardic to the 110s when I set her up to auscultate her lungs.  Will check basic lab work including troponin, administer 1 L IV fluids, initiate prednisone.  Clinical Course as of Mar 22 724  Sat Mar 22, 2017  [id number] Updated patient and spouse of lab results.  Reexamined lungs which are without wheezing.  Room air saturations 97%.  IV fluids completed.  Will discharge home with prednisone prescription so patient ends up taking a 5-day burst.  Strict return precautions given.  Both  verbalize understanding and agree with plan of care.  [JS]    Clinical Course User Index [JS] Irean HongSung, Jade J, MD     ____________________________________________   FINAL CLINICAL IMPRESSION(S) / ED DIAGNOSES  Final diagnoses:  COPD exacerbation (HCC)  Bronchitis     ED Discharge Orders    None       Note:  This document was prepared using Dragon voice recognition software and may include unintentional dictation errors.    Irean HongSung, Jade J, MD 03/22/17 [phone]

## 2017-03-22 NOTE — Discharge Instructions (Signed)
1. Take antibiotic as prescribed by your doctor. 2. Take steroid as prescribed for total dosage Prednisone 60mg  daily x 4 more days. 3. You may take cough medicine as needed (Tussionex). 4. Drink plenty of fluids daily. 5. Return to the ER for worsening symptoms, persistent vomiting, difficulty breathing or other concerns.

## 2017-03-22 NOTE — ED Notes (Signed)
Reviewed discharge instructions, follow-up care, and prescriptions with patient. Patient verbalized understanding of all information reviewed. Patient stable, with no distress noted at this time.    

## 2017-03-24 ENCOUNTER — Ambulatory Visit: Payer: Medicare Other | Admitting: Podiatry

## 2017-04-15 ENCOUNTER — Ambulatory Visit: Payer: Medicare Other | Admitting: Podiatry

## 2017-09-07 ENCOUNTER — Other Ambulatory Visit: Payer: Self-pay

## 2017-09-07 ENCOUNTER — Ambulatory Visit
Admission: EM | Admit: 2017-09-07 | Discharge: 2017-09-07 | Disposition: A | Discharge status: Home / Self Care | Payer: Medicare Other | Attending: Family Medicine | Admitting: Family Medicine

## 2017-09-07 DIAGNOSIS — J029 Acute pharyngitis, unspecified: Secondary | ICD-10-CM

## 2017-09-07 LAB — RAPID STREP SCREEN (MED CTR MEBANE ONLY): STREPTOCOCCUS, GROUP A SCREEN (DIRECT): NEGATIVE

## 2017-09-07 MED ORDER — LIDOCAINE VISCOUS HCL 2 % MT SOLN: 20.0000 mL | OROMUCOSAL | 0 refills | Status: DC | PRN

## 2017-09-07 MED ORDER — HYDROCOD POLST-CPM POLST ER 10-8 MG/5ML PO SUER: 5.0000 mL | Freq: Every evening | ORAL | 0 refills | Status: DC | PRN

## 2017-09-07 NOTE — Discharge Instructions (Signed)
As discussed, suspect sore throat is viral in nature.  Please continue conservative therapy as discussed.  Cough syrup at bedtime as needed. Viscous Lidocaine swish and swallow for comfort.  Please follow-up here or with your PCP if symptoms worsen.

## 2017-09-07 NOTE — ED Provider Notes (Signed)
[facility]    CSN: [id number] Arrival date & time: 09/07/17  0809     History   Chief Complaint Chief Complaint  Patient presents with  . Sore Throat    HPI Caitlin House is a 72 y.o. female.   CC: sore throat x 3 days, left sided, waxed and waned.   Endorses painful to swallow. Started to have clear phelgm and cough last night  Little wheezing last night when coughing.   Using nasal spray without relief.  No post nasal drip, fever, cp  Former smoker   03/22/17 COPD exacerbation  HTN- at home 140/90s. Compliant with medications.       Past Medical History:  Diagnosis Date  . Asthma flare   . COPD (chronic obstructive pulmonary disease) (HCC)   . Frequent headaches   . Hypertension   . Sinus problem     There are no active problems to display for this patient.   Past Surgical History:  Procedure Laterality Date  . ABDOMINAL HYSTERECTOMY    . BREAST BIOPSY Right 1996   neg  . BREAST SURGERY Right   . GALLBLADDER SURGERY    . HEMORRHOID SURGERY    . TRIGGER FINGER RELEASE Right     OB History   None      Home Medications    Prior to Admission medications   Medication Sig Start Date End Date Taking? Authorizing Provider  albuterol (PROVENTIL HFA) 108 (90 Base) MCG/ACT inhaler Inhale into the lungs. 01/19/16   [provider]  atenolol (TENORMIN) 100 MG tablet Take 100 mg by mouth daily.    [provider]  atenolol-chlorthalidone (TENORETIC) 50-25 MG tablet Take by mouth. 05/14/16   [provider]  bisoprolol-hydrochlorothiazide (ZIAC) 5-6.25 MG per tablet Take 1 tablet by mouth daily.    [provider]  butalbital-aspirin-caffeine Benny Lennert) 50-325-40 MG capsule Take by mouth. 05/13/16   [provider]  chlorpheniramine-HYDROcodone (TUSSIONEX PENNKINETIC ER) 10-8 MG/5ML SUER Take 5 mLs by mouth at bedtime as needed for cough. 09/07/17   Allegra Grana, FNP  Cholecalciferol  (VITAMIN D3) 2000 units capsule Take by mouth.    [provider]  cyclobenzaprine (FLEXERIL) 5 MG tablet Take 1 tablet (5 mg total) by mouth at bedtime. Can cause drowsiness. 06/24/15   Jolene Provost, MD  Fluticasone-Salmeterol (ADVAIR) 100-50 MCG/DOSE AEPB Inhale 1 puff into the lungs 2 (two) times daily.    [provider]  hydrochlorothiazide (HYDRODIURIL) 25 MG tablet Take 25 mg by mouth daily.    [provider]  lidocaine (XYLOCAINE) 2 % solution Use as directed 20 mLs in the mouth or throat every 3 (three) hours as needed for mouth pain (gargle; may spit or swallow). 09/07/17   Allegra Grana, FNP  losartan (COZAAR) 50 MG tablet Take by mouth. 10/28/16   [provider]  mometasone (NASONEX) 50 MCG/ACT nasal spray Place into the nose.    [provider]  naproxen (NAPROSYN) 500 MG tablet Take 1 tablet (500 mg total) by mouth 2 (two) times daily. Take with food. 06/24/15   Jolene Provost, MD  pravastatin (PRAVACHOL) 20 MG tablet Take by mouth. 08/27/16   [provider]  predniSONE (DELTASONE) 20 MG tablet 3 tablets PO qd x 3 days 03/22/17   Irean Hong, MD  vitamin B-12 (CYANOCOBALAMIN) 1000 MCG tablet Take by mouth.    [provider]    Family History Family History  Problem Relation Age of Onset  .  Cancer Father   . Breast cancer Maternal Aunt     Social History Social History   Tobacco Use  . Smoking status: Former Games developermoker  . Smokeless tobacco: Never Used  Substance Use Topics  . Alcohol use: Yes    Comment: rarely  . Drug use: No     Allergies   Venlafaxine; Sulfa antibiotics; and Lisinopril   Review of Systems Review of Systems  Constitutional: Negative for chills and fever.  HENT: Positive for congestion, sore throat and trouble swallowing. Negative for sinus pressure and sinus pain.   Respiratory: Positive for cough and wheezing.   Cardiovascular: Negative for chest pain and palpitations.    Gastrointestinal: Negative for nausea and vomiting.     Physical Exam Triage Vital Signs ED Triage Vitals  Enc Vitals Group     BP 09/07/17 0820 (!) 153/70     Pulse Rate 09/07/17 0820 79     Resp 09/07/17 0820 18     Temp 09/07/17 0820 98 F (36.7 C)     Temp Source 09/07/17 0820 Oral     SpO2 09/07/17 0820 98 %     Weight 09/07/17 0822 197 lb (89.4 kg)     Height 09/07/17 0822 5\' 3"  (1.6 m)     Head Circumference --      Peak Flow --      Pain Score 09/07/17 0822 2     Pain Loc --      Pain Edu? --      Excl. in GC? --    No data found.  Updated Vital Signs BP (!) 153/70 (BP Location: Left Arm)   Pulse 79   Temp 98 F (36.7 C) (Oral)   Resp 18   Ht 5\' 3"  (1.6 m)   Wt 197 lb (89.4 kg)   SpO2 98%   BMI 34.90 kg/m   Visual Acuity Right Eye Distance:   Left Eye Distance:   Bilateral Distance:    Right Eye Near:   Left Eye Near:    Bilateral Near:     Physical Exam  Constitutional: She appears well-developed and well-nourished.  HENT:  Head: Normocephalic and atraumatic.  Right Ear: Hearing, tympanic membrane, external ear and ear canal normal. No drainage, swelling or tenderness. No foreign bodies. Tympanic membrane is not erythematous and not bulging. No middle ear effusion. No decreased hearing is noted.  Left Ear: Hearing, tympanic membrane, external ear and ear canal normal. No drainage, swelling or tenderness. No foreign bodies. Tympanic membrane is not erythematous and not bulging.  No middle ear effusion. No decreased hearing is noted.  Nose: Nose normal. No rhinorrhea. Right sinus exhibits no maxillary sinus tenderness and no frontal sinus tenderness. Left sinus exhibits no maxillary sinus tenderness and no frontal sinus tenderness.  Mouth/Throat: Uvula is midline and mucous membranes are normal. Posterior oropharyngeal erythema present. No oropharyngeal exudate, posterior oropharyngeal edema or tonsillar abscesses.    Left sided tonsillar erythema. No  exudate Tonsils 1/4.   Eyes: Conjunctivae are normal.  Cardiovascular: Regular rhythm, normal heart sounds and normal pulses.  Pulmonary/Chest: Effort normal and breath sounds normal. She has no wheezes. She has no rhonchi. She has no rales.  Lymphadenopathy:       Head (right side): No submental, no submandibular, no tonsillar, no preauricular, no posterior auricular and no occipital adenopathy present.       Head (left side): No submental, no submandibular, no tonsillar, no preauricular, no posterior auricular and no occipital adenopathy present.  She has no cervical adenopathy.  Neurological: She is alert.  Skin: Skin is warm and dry.  Psychiatric: She has a normal mood and affect. Her speech is normal and behavior is normal. Thought content normal.  Vitals reviewed.    UC Treatments / Results  Labs (all labs ordered are listed, but only abnormal results are displayed) Labs Reviewed  RAPID STREP SCREEN ([facility] ONLY)  CULTURE, GROUP A STREP [facility])    EKG None  Radiology No results found.  Procedures Procedures (including critical care time)  Medications Ordered in UC Medications - No data to display  Initial Impression / Assessment and Plan / UC Course  I have reviewed the triage vital signs and the nursing notes.  Pertinent labs & imaging results that were available during my care of the patient were reviewed by me and considered in my medical decision making (see chart for details).       Final Clinical Impressions(s) / UC Diagnoses   Final diagnoses:  Viral pharyngitis  Patient is well appearing. No acute respiratory distress. No signs, symptoms to suggest COPD exacerbation. Will treat conservatively based on duration of symptoms, presentation. Strep Negative. Patient will follow up here or with pcp if no improvement. Of note, suspect blood pressure mildly elevated in context of cough. Patient will monitor at home and follow up with pcp.    Discharge  Instructions     As discussed, suspect sore throat is viral in nature.  Please continue conservative therapy as discussed.  Cough syrup at bedtime as needed. Viscous Lidocaine swish and swallow for comfort.  Please follow-up here or with your PCP if symptoms worsen.    ED Prescriptions    Medication Sig Dispense Auth. Provider   chlorpheniramine-HYDROcodone (TUSSIONEX PENNKINETIC ER) 10-8 MG/5ML SUER Take 5 mLs by mouth at bedtime as needed for cough. 60 mL Allegra Grana, FNP   lidocaine (XYLOCAINE) 2 % solution Use as directed 20 mLs in the mouth or throat every 3 (three) hours as needed for mouth pain (gargle; may spit or swallow). 100 mL Allegra Grana, FNP     Controlled Substance Prescriptions [place] Controlled Substance Registry consulted? NoAllegra Grana, [place] 09/07/17 [phone]

## 2017-09-07 NOTE — ED Triage Notes (Signed)
Pt with past several days of left sided sore throat. Last night started with cough and nasal drainage.

## 2017-09-10 LAB — CULTURE, GROUP A STREP (THRC)

## 2017-12-23 ENCOUNTER — Other Ambulatory Visit: Payer: Self-pay | Admitting: Internal Medicine

## 2017-12-23 DIAGNOSIS — Z1231 Encounter for screening mammogram for malignant neoplasm of breast: Secondary | ICD-10-CM

## 2018-02-02 ENCOUNTER — Ambulatory Visit
Admission: RE | Admit: 2018-02-02 | Discharge: 2018-02-02 | Disposition: A | Discharge status: Home / Self Care | Payer: Medicare Other | Source: Ambulatory Visit | Attending: Internal Medicine | Admitting: Internal Medicine

## 2018-02-02 ENCOUNTER — Encounter (INDEPENDENT_AMBULATORY_CARE_PROVIDER_SITE_OTHER): Payer: Self-pay

## 2018-02-02 DIAGNOSIS — Z1231 Encounter for screening mammogram for malignant neoplasm of breast: Secondary | ICD-10-CM | POA: Diagnosis present

## 2018-08-24 ENCOUNTER — Other Ambulatory Visit: Payer: Self-pay | Admitting: Sports Medicine

## 2018-08-24 DIAGNOSIS — G8929 Other chronic pain: Secondary | ICD-10-CM

## 2018-09-14 ENCOUNTER — Ambulatory Visit
Admission: RE | Admit: 2018-09-14 | Discharge: 2018-09-14 | Disposition: A | Discharge status: Home / Self Care | Payer: Medicare Other | Source: Ambulatory Visit | Attending: Sports Medicine | Admitting: Sports Medicine

## 2018-09-14 ENCOUNTER — Other Ambulatory Visit: Payer: Self-pay

## 2018-09-14 DIAGNOSIS — M25561 Pain in right knee: Secondary | ICD-10-CM | POA: Insufficient documentation

## 2018-09-14 DIAGNOSIS — G8929 Other chronic pain: Secondary | ICD-10-CM | POA: Diagnosis present

## 2018-09-17 ENCOUNTER — Other Ambulatory Visit: Payer: Self-pay

## 2018-09-17 ENCOUNTER — Encounter: Payer: Self-pay | Admitting: *Deleted

## 2018-09-25 ENCOUNTER — Other Ambulatory Visit: Payer: Self-pay

## 2018-09-25 ENCOUNTER — Other Ambulatory Visit
Admission: RE | Admit: 2018-09-25 | Discharge: 2018-09-25 | Disposition: A | Discharge status: Home / Self Care | Payer: Medicare Other | Source: Ambulatory Visit | Attending: Orthopedic Surgery | Admitting: Orthopedic Surgery

## 2018-09-25 DIAGNOSIS — Z1159 Encounter for screening for other viral diseases: Secondary | ICD-10-CM | POA: Diagnosis present

## 2018-09-25 LAB — SARS CORONAVIRUS 2 (TAT 6-24 HRS): SARS Coronavirus 2: NEGATIVE

## 2018-09-29 ENCOUNTER — Encounter: Payer: Self-pay | Admitting: *Deleted

## 2018-09-29 ENCOUNTER — Ambulatory Visit
Admission: RE | Admit: 2018-09-29 | Discharge: 2018-09-29 | Disposition: A | Discharge status: Home / Self Care | Payer: Medicare Other | Attending: Orthopedic Surgery | Admitting: Orthopedic Surgery

## 2018-09-29 ENCOUNTER — Encounter
Admission: RE | Disposition: A | Discharge status: Home / Self Care | Payer: Self-pay | Source: Home / Self Care | Attending: Orthopedic Surgery

## 2018-09-29 ENCOUNTER — Ambulatory Visit: Payer: Medicare Other | Admitting: Anesthesiology

## 2018-09-29 DIAGNOSIS — M1711 Unilateral primary osteoarthritis, right knee: Secondary | ICD-10-CM | POA: Insufficient documentation

## 2018-09-29 DIAGNOSIS — S83241A Other tear of medial meniscus, current injury, right knee, initial encounter: Secondary | ICD-10-CM | POA: Insufficient documentation

## 2018-09-29 DIAGNOSIS — G43909 Migraine, unspecified, not intractable, without status migrainosus: Secondary | ICD-10-CM | POA: Insufficient documentation

## 2018-09-29 DIAGNOSIS — Z79899 Other long term (current) drug therapy: Secondary | ICD-10-CM | POA: Diagnosis not present

## 2018-09-29 DIAGNOSIS — X58XXXA Exposure to other specified factors, initial encounter: Secondary | ICD-10-CM | POA: Diagnosis not present

## 2018-09-29 DIAGNOSIS — E785 Hyperlipidemia, unspecified: Secondary | ICD-10-CM | POA: Insufficient documentation

## 2018-09-29 DIAGNOSIS — J449 Chronic obstructive pulmonary disease, unspecified: Secondary | ICD-10-CM | POA: Diagnosis not present

## 2018-09-29 DIAGNOSIS — M25561 Pain in right knee: Secondary | ICD-10-CM | POA: Diagnosis present

## 2018-09-29 DIAGNOSIS — E538 Deficiency of other specified B group vitamins: Secondary | ICD-10-CM | POA: Diagnosis not present

## 2018-09-29 DIAGNOSIS — S83281A Other tear of lateral meniscus, current injury, right knee, initial encounter: Secondary | ICD-10-CM | POA: Diagnosis not present

## 2018-09-29 DIAGNOSIS — Z87891 Personal history of nicotine dependence: Secondary | ICD-10-CM | POA: Diagnosis not present

## 2018-09-29 DIAGNOSIS — I10 Essential (primary) hypertension: Secondary | ICD-10-CM | POA: Insufficient documentation

## 2018-09-29 HISTORY — PX: KNEE ARTHROSCOPY WITH MEDIAL MENISECTOMY: SHX5651

## 2018-09-29 HISTORY — DX: Presence of dental prosthetic device (complete) (partial): Z97.2

## 2018-09-29 HISTORY — DX: Inflammatory liver disease, unspecified: K75.9

## 2018-09-29 SURGERY — ARTHROSCOPY, KNEE, WITH MEDIAL MENISCECTOMY
Anesthesia: General | Site: Knee | Laterality: Right

## 2018-09-29 MED ORDER — LIDOCAINE HCL (CARDIAC) PF 100 MG/5ML IV SOSY
PREFILLED_SYRINGE | INTRAVENOUS | Status: DC | PRN
  Administered 2018-09-29: 50 mg via INTRATRACHEAL

## 2018-09-29 MED ORDER — BUPIVACAINE HCL (PF) 0.5 % IJ SOLN
INTRAMUSCULAR | Status: DC | PRN
  Administered 2018-09-29: .5 mL
  Administered 2018-09-29: 6 mL

## 2018-09-29 MED ORDER — IBUPROFEN 800 MG PO TABS: 800.0000 mg | ORAL_TABLET | Freq: Three times a day (TID) | ORAL | 0 refills | Status: AC

## 2018-09-29 MED ORDER — FENTANYL CITRATE (PF) 100 MCG/2ML IJ SOLN: 25.0000 ug | INTRAMUSCULAR | Status: DC | PRN

## 2018-09-29 MED ORDER — LIDOCAINE-EPINEPHRINE 1 %-1:100000 IJ SOLN
INTRAMUSCULAR | Status: DC | PRN
  Administered 2018-09-29: .5 mL
  Administered 2018-09-29: 6 mL

## 2018-09-29 MED ORDER — HYDROCODONE-ACETAMINOPHEN 5-325 MG PO TABS: 1.0000 | ORAL_TABLET | ORAL | 0 refills | Status: DC | PRN

## 2018-09-29 MED ORDER — ONDANSETRON 4 MG PO TBDP: 4.0000 mg | ORAL_TABLET | Freq: Three times a day (TID) | ORAL | 0 refills | Status: DC | PRN

## 2018-09-29 MED ORDER — PROPOFOL 10 MG/ML IV BOLUS
INTRAVENOUS | Status: DC | PRN
  Administered 2018-09-29: 150 mg via INTRAVENOUS

## 2018-09-29 MED ORDER — ASPIRIN EC 325 MG PO TBEC: 325.0000 mg | DELAYED_RELEASE_TABLET | Freq: Every day | ORAL | 0 refills | Status: AC

## 2018-09-29 MED ORDER — ACETAMINOPHEN 500 MG PO TABS: 1000.0000 mg | ORAL_TABLET | Freq: Three times a day (TID) | ORAL | 2 refills | Status: AC

## 2018-09-29 MED ORDER — ONDANSETRON HCL 4 MG/2ML IJ SOLN: 4.0000 mg | Freq: Once | INTRAMUSCULAR | Status: DC | PRN

## 2018-09-29 MED ORDER — GLYCOPYRROLATE 0.2 MG/ML IJ SOLN
INTRAMUSCULAR | Status: DC | PRN
  Administered 2018-09-29: 0.1 mg via INTRAVENOUS

## 2018-09-29 MED ORDER — LACTATED RINGERS IV SOLN
INTRAVENOUS | Status: DC
  Administered 2018-09-29: 13:00:00 via INTRAVENOUS

## 2018-09-29 MED ORDER — OXYCODONE HCL 5 MG/5ML PO SOLN: 5.0000 mg | Freq: Once | ORAL | Status: DC | PRN

## 2018-09-29 MED ORDER — DEXAMETHASONE SODIUM PHOSPHATE 4 MG/ML IJ SOLN
INTRAMUSCULAR | Status: DC | PRN
  Administered 2018-09-29: 4 mg via INTRAVENOUS

## 2018-09-29 MED ORDER — DEXTROSE 5 % IV SOLN
2000.0000 mg | Freq: Once | INTRAVENOUS | Status: AC
  Administered 2018-09-29: 2000 mg via INTRAVENOUS

## 2018-09-29 MED ORDER — ONDANSETRON HCL 4 MG/2ML IJ SOLN
INTRAMUSCULAR | Status: DC | PRN
  Administered 2018-09-29: 4 mg via INTRAVENOUS

## 2018-09-29 MED ORDER — EPHEDRINE SULFATE 50 MG/ML IJ SOLN
INTRAMUSCULAR | Status: DC | PRN
  Administered 2018-09-29: 10 mg via INTRAVENOUS

## 2018-09-29 MED ORDER — FENTANYL CITRATE (PF) 100 MCG/2ML IJ SOLN
INTRAMUSCULAR | Status: DC | PRN
  Administered 2018-09-29 (×2): 25 ug via INTRAVENOUS
  Administered 2018-09-29: 50 ug via INTRAVENOUS
  Administered 2018-09-29: 25 ug via INTRAVENOUS

## 2018-09-29 MED ORDER — OXYCODONE HCL 5 MG PO TABS: 5.0000 mg | ORAL_TABLET | Freq: Once | ORAL | Status: DC | PRN

## 2018-09-29 MED ORDER — MIDAZOLAM HCL 5 MG/5ML IJ SOLN
INTRAMUSCULAR | Status: DC | PRN
  Administered 2018-09-29: 2 mg via INTRAVENOUS

## 2018-09-29 SURGICAL SUPPLY — 36 items
ADAPTER IRRIG TUBE 2 SPIKE SOL (ADAPTER) ×6 IMPLANT
BLADE SURG SZ11 CARB STEEL (BLADE) ×3 IMPLANT
BNDG COHESIVE 4X5 TAN STRL (GAUZE/BANDAGES/DRESSINGS) ×3 IMPLANT
BNDG ESMARK 6X12 TAN STRL LF (GAUZE/BANDAGES/DRESSINGS) ×3 IMPLANT
BUR RADIUS 3.5 (BURR) ×2 IMPLANT
CHLORAPREP W/TINT 26 (MISCELLANEOUS) ×3 IMPLANT
COOLER POLAR GLACIER W/PUMP (MISCELLANEOUS) ×3 IMPLANT
COVER LIGHT HANDLE UNIVERSAL (MISCELLANEOUS) ×6 IMPLANT
CUFF TOURN SGL QUICK 24 (TOURNIQUET CUFF) ×2
CUFF TRNQT CYL 24X4X16.5-23 (TOURNIQUET CUFF) ×1 IMPLANT
DRAPE IMP U-DRAPE 54X76 (DRAPES) ×3 IMPLANT
GAUZE SPONGE 4X4 12PLY STRL (GAUZE/BANDAGES/DRESSINGS) ×3 IMPLANT
GLOVE BIO SURGEON STRL SZ7.5 (GLOVE) ×3 IMPLANT
GLOVE BIOGEL PI IND STRL 8 (GLOVE) ×1 IMPLANT
GLOVE BIOGEL PI INDICATOR 8 (GLOVE) ×2
GOWN STRL REIN 2XL XLG LVL4 (GOWN DISPOSABLE) ×3 IMPLANT
GOWN STRL REUS W/ TWL LRG LVL3 (GOWN DISPOSABLE) ×1 IMPLANT
GOWN STRL REUS W/TWL LRG LVL3 (GOWN DISPOSABLE) ×2
IV LACTATED RINGER IRRG 3000ML (IV SOLUTION) ×8
IV LR IRRIG 3000ML ARTHROMATIC (IV SOLUTION) ×4 IMPLANT
KIT TURNOVER KIT A (KITS) ×3 IMPLANT
MAT ABSORB  FLUID 56X50 GRAY (MISCELLANEOUS) ×2
MAT ABSORB FLUID 56X50 GRAY (MISCELLANEOUS) ×1 IMPLANT
NEPTUNE MANIFOLD (MISCELLANEOUS) ×3 IMPLANT
PACK ARTHROSCOPY KNEE (MISCELLANEOUS) ×3 IMPLANT
PAD WRAPON POLAR KNEE (MISCELLANEOUS) ×1 IMPLANT
PADDING CAST BLEND 6X4 STRL (MISCELLANEOUS) ×1 IMPLANT
PADDING STRL CAST 6IN (MISCELLANEOUS) ×2
SET TUBE SUCT SHAVER OUTFL 24K (TUBING) ×3 IMPLANT
SET TUBE TIP INTRA-ARTICULAR (MISCELLANEOUS) ×3 IMPLANT
SUT ETHILON 3-0 FS-10 30 BLK (SUTURE) ×3
SUTURE EHLN 3-0 FS-10 30 BLK (SUTURE) ×1 IMPLANT
TOWEL OR 17X26 4PK STRL BLUE (TOWEL DISPOSABLE) ×6 IMPLANT
TUBING ARTHRO INFLOW-ONLY STRL (TUBING) ×3 IMPLANT
WAND WEREWOLF FLOW 90D (MISCELLANEOUS) ×2 IMPLANT
WRAPON POLAR PAD KNEE (MISCELLANEOUS) ×3

## 2018-09-29 NOTE — Anesthesia Procedure Notes (Addendum)
Procedure Name: LMA Insertion Date/Time: 09/29/2018 1:44 PM Performed by: Mayme Genta, CRNA Pre-anesthesia Checklist: Patient identified, Emergency Drugs available, Suction available, Timeout performed and Patient being monitored Patient Re-evaluated:Patient Re-evaluated prior to induction Oxygen Delivery Method: Circle system utilized Preoxygenation: Pre-oxygenation with 100% oxygen Induction Type: IV induction LMA: LMA inserted LMA Size: 4.0 Number of attempts: 1 Placement Confirmation: positive ETCO2 and breath sounds checked- equal and bilateral Tube secured with: Tape

## 2018-09-29 NOTE — H&P (Signed)
Paper H&P to be scanned into permanent record. H&P reviewed. No significant changes noted.  

## 2018-09-29 NOTE — Anesthesia Preprocedure Evaluation (Signed)
Anesthesia Evaluation  Patient identified by MRN, date of birth, ID band Patient awake    Reviewed: Allergy & Precautions, H&P , NPO status , Patient's Chart, lab work & pertinent test results, reviewed documented beta blocker date and time   Airway Mallampati: II  TM Distance: >3 FB Neck ROM: full    Dental no notable dental hx.    Pulmonary asthma , COPD, former smoker,    Pulmonary exam normal breath sounds clear to auscultation       Cardiovascular Exercise Tolerance: Good hypertension,  Rhythm:regular Rate:Normal     Neuro/Psych  Headaches, negative psych ROS   GI/Hepatic negative GI ROS, Neg liver ROS,   Endo/Other  negative endocrine ROS  Renal/GU negative Renal ROS  negative genitourinary   Musculoskeletal   Abdominal   Peds  Hematology negative hematology ROS (+)   Anesthesia Other Findings   Reproductive/Obstetrics negative OB ROS                             Anesthesia Physical Anesthesia Plan  ASA: II  Anesthesia Plan: General   Post-op Pain Management:    Induction:   PONV Risk Score and Plan:   Airway Management Planned:   Additional Equipment:   Intra-op Plan:   Post-operative Plan:   Informed Consent: I have reviewed the patients History and Physical, chart, labs and discussed the procedure including the risks, benefits and alternatives for the proposed anesthesia with the patient or authorized representative who has indicated his/her understanding and acceptance.     Dental Advisory Given  Plan Discussed with: CRNA  Anesthesia Plan Comments:         Anesthesia Quick Evaluation

## 2018-09-29 NOTE — Op Note (Signed)
Operative Note    SURGERY DATE: 09/29/2018   PRE-OP DIAGNOSIS:  1. Right medial and lateral meniscus tears 2. Right knee degenerative changes   POST-OP DIAGNOSIS:  1. Right medial and lateral meniscus tears 2. Right knee degenerative changes   PROCEDURES:  1. Right knee arthroscopy, partial medial and lateral meniscectomy 2. Chondroplasty of patellofemoral, medial, and lateral compartments   SURGEON: Cato Mulligan, MD   ANESTHESIA: Gen   ESTIMATED BLOOD LOSS: minimal   TOTAL IV FLUIDS: per anesthesia   INDICATION(S):  Caitlin House is a 73 y.o. female with signs and symptoms as well as MRI finding of medial and lateral meniscus tears.  She also has significant tricompartmental degenerative changes.  We did discuss that she may not get complete or prolonged pain relief from the surgery due to her baseline existing arthritis.  After discussion of risks, benefits, and alternatives to surgery, the patient elected to proceed.   OPERATIVE FINDINGS:    Examination under anesthesia: A careful examination under anesthesia was performed.  Passive range of motion was: Hyperextension: 2.  Extension: 0.  Flexion: 120.  Lachman: normal. Pivot Shift: normal.  Posterior drawer: normal.  Varus stability in full extension: normal.  Varus stability in 30 degrees of flexion: normal.  Valgus stability in full extension: normal.  Valgus stability in 30 degrees of flexion: normal.   Intra-operative findings: A thorough arthroscopic examination of the knee was performed.  The findings are: 1. Suprapatellar pouch: Normal 2. Undersurface of median ridge: Grade 2 degenerative changes  3. Medial patellar facet: Grade 2 degenerative changes  4. Lateral patellar facet: Grade 2 degenerative changes  5. Trochlea: Grade 2 degenerative changes  6. Lateral gutter/popliteus tendon: Normal 7. Hoffa's fat pad: Inflamed 8. Medial gutter/plica: Normal 9. ACL: Normal 10. PCL: Normal 11. Medial meniscus:  Horizontal tear of the posterior horn 12. Medial compartment cartilage: Large areas of grade 3 degenerative changes on the medial femoral condyle, grade 1 degenerative changes to the tibial plateau 13. Lateral meniscus: Parrot-beak type tear of the posterior horn affecting approximately 50% of the posterior horn meniscus width 14. Lateral compartment cartilage:  Grade 1 degenerative changes to the lateral femoral condyle and focal area approximately 8 mm x 15 mm of grade 3-4 degenerative changes of the tibial plateau   OPERATIVE REPORT:     I identified [employer] in the pre-operative holding area. I marked the operative knee with my initials. I reviewed the risks and benefits of the proposed surgical intervention and the patient (and/or patient's guardian) wished to proceed. The patient was transferred to the operative suite and placed in the supine position with all bony prominences padded.  Anesthesia was administered. Appropriate IV antibiotics were administered prior to incision. The extremity was then prepped and draped in standard fashion. A time out was performed confirming the correct extremity, correct patient, and correct procedure.   Arthroscopy portals were marked. Local anesthetic was injected to the planned portal sites. The anterolateral portal was established with an 11 blade.      The arthroscope was placed in the anterolateral portal and then into the suprapatellar pouch.  A diagnostic knee scope was completed with the above findings. The medial and lateral meniscus tears were identified.   Next the medial portal was established under needle localization. The lateral meniscal tear was debrided using an arthroscopic biter and an oscillating shaver until the meniscus had stable borders.  Next, I turned my attention to the medial meniscus tear.  The MCL was pie-crusted to improve visualization of the posterior horn of the medial meniscus.the medial meniscus tear was  debrided using combination of arthroscopic biters and an oscillating shaver until the meniscus had stable borders.  A chondroplasty was performed of the medial compartment, lateral compartment, and patellofemoral compartment such that there were stable cartilage edges without any loose fragments of cartilage. Arthroscopic fluid was removed from the joint.   The portals were closed with 3-0 Nylon suture. Sterile dressings included Xeroform, 4x4s, Sof-Rol, and Bias wrap. A Polarcare was placed.  The patient was then awakened and taken to the PACU hemodynamically stable without complication.     POSTOPERATIVE PLAN: The patient will be discharged home today once they meet PACU criteria. Aspirin 325 mg daily was prescribed for 2 weeks for DVT prophylaxis.  Physical therapy will start on POD#3-4. Weight-bearing as tolerated. Follow up in 2 weeks per protocol.

## 2018-09-29 NOTE — Discharge Instructions (Signed)
Arthroscopic Knee Surgery - Partial Meniscectomy °  °Post-Op Instructions °  °1. Bracing or crutches: Crutches will be provided at the time of discharge from the surgery center if you do not already have them. °  °2. Ice: You may be provided with a device (Polar Care) that allows you to ice the affected area effectively. Otherwise you can ice manually.  °  °3. Driving:  Plan on not driving for at least two weeks. Please note that you are advised NOT to drive while taking narcotic pain medications as you may be impaired and unsafe to drive. °  °4. Activity: Ankle pumps several times an hour while awake to prevent blood clots. Weight bearing: as tolerated. Use crutches for as needed (usually ~1 week or less) until pain allows you to ambulate without a limp. Bending and straightening the knee is unlimited. Elevate knee above heart level as much as possible for one week. Avoid standing more than 5 minutes (consecutively) for the first week.  Avoid long distance travel for 2 weeks. ° °5. Medications:  °- You have been provided a prescription for narcotic pain medicine. After surgery, take 1-2 narcotic tablets every 4 hours if needed for severe pain.  °- You may take up to 3000mg/day of tylenol (acetaminophen). You can take 1000mg 3x/day. Please check your narcotic. If you have acetaminophen in your narcotic (each tablet will be 325mg), be careful not to exceed a total of 3000mg/day of acetaminophen.  °- A prescription for anti-nausea medication will be provided in case the narcotic medicine or anesthesia causes nausea - take 1 tablet every 6 hours only if nauseated.  °- Take ibuprofen 800 mg every 8 hours WITH food to reduce post-operative knee swelling. DO NOT STOP IBUPROFEN POST-OP UNTIL INSTRUCTED TO DO SO at first post-op office visit (10-14 days after surgery). However, please discontinue if you have any abdominal discomfort after taking this.  °- Take enteric coated aspirin 325 mg once daily for 2 weeks to prevent  blood clots.  ° ° °6. Bandages: The physical therapist should change the bandages at the first post-op appointment. If needed, the dressing supplies have been provided to you. °  °7. Physical Therapy: 1-2 times per week for 6 weeks. Therapy typically starts on post operative Day 3 or 4. You have been provided an order for physical therapy. The therapist will provide home exercises. °  °8. Work: May return to full work usually around 2 weeks after 1st post-operative visit. May do light duty/desk job in approximately 1-2 weeks when off of narcotics, pain is well-controlled, and swelling has decreased. Labor intensive jobs may require 4-6 weeks to return.  °  °  °9. Post-Op Appointments: °Your first post-op appointment will be with Dr. Christhoper House in approximately 2 weeks time.  °  °If you find that they have not been scheduled please call the Orthopaedic Appointment front desk at [phone]. ° ° ° °General Anesthesia, Adult, Care After °This sheet gives you information about how to care for yourself after your procedure. Your health care provider may also give you more specific instructions. If you have problems or questions, contact your health care provider. °What can I expect after the procedure? °After the procedure, the following side effects are common: °· Pain or discomfort at the IV site. °· Nausea. °· Vomiting. °· Sore throat. °· Trouble concentrating. °· Feeling cold or chills. °· Weak or tired. °· Sleepiness and fatigue. °· Soreness and body aches. These side effects can affect parts of   the body that were not involved in surgery. °Follow these instructions at home: ° °For at least 24 hours after the procedure: °· Have a responsible adult stay with you. It is important to have someone help care for you until you are awake and alert. °· Rest as needed. °· Do not: °? Participate in activities in which you could fall or become injured. °? Drive. °? Use heavy machinery. °? Drink alcohol. °? Take sleeping pills or  medicines that cause drowsiness. °? Make important decisions or sign legal documents. °? Take care of children on your own. °Eating and drinking °· Follow any instructions from your health care provider about eating or drinking restrictions. °· When you feel hungry, start by eating small amounts of foods that are soft and easy to digest (bland), such as toast. Gradually return to your regular diet. °· Drink enough fluid to keep your urine pale yellow. °· If you vomit, rehydrate by drinking water, juice, or clear broth. °General instructions °· If you have sleep apnea, surgery and certain medicines can increase your risk for breathing problems. Follow instructions from your health care provider about wearing your sleep device: °? Anytime you are sleeping, including during daytime naps. °? While taking prescription pain medicines, sleeping medicines, or medicines that make you drowsy. °· Return to your normal activities as told by your health care provider. Ask your health care provider what activities are safe for you. °· Take over-the-counter and prescription medicines only as told by your health care provider. °· If you smoke, do not smoke without supervision. °· Keep all follow-up visits as told by your health care provider. This is important. °Contact a health care provider if: °· You have nausea or vomiting that does not get better with medicine. °· You cannot eat or drink without vomiting. °· You have pain that does not get better with medicine. °· You are unable to pass urine. °· You develop a skin rash. °· You have a fever. °· You have redness around your IV site that gets worse. °Get help right away if: °· You have difficulty breathing. °· You have chest pain. °· You have blood in your urine or stool, or you vomit blood. °Summary °· After the procedure, it is common to have a sore throat or nausea. It is also common to feel tired. °· Have a responsible adult stay with you for the first 24 hours after general  anesthesia. It is important to have someone help care for you until you are awake and alert. °· When you feel hungry, start by eating small amounts of foods that are soft and easy to digest (bland), such as toast. Gradually return to your regular diet. °· Drink enough fluid to keep your urine pale yellow. °· Return to your normal activities as told by your health care provider. Ask your health care provider what activities are safe for you. °This information is not intended to replace advice given to you by your health care provider. Make sure you discuss any questions you have with your health care provider. °Document Released: 06/03/2000 Document Revised: 02/28/2017 Document Reviewed: 10/11/2016 °Elsevier Patient Education © 2020 [employer] ° °

## 2018-09-29 NOTE — Transfer of Care (Signed)
Immediate Anesthesia Transfer of Care Note  Patient: Caitlin House  Procedure(s) Performed: KNEE ARTHROSCOPY WITH PARTIAL MEDIAL AND LATERAL MENISECTOMY SYNOVECTEMY (Right Knee)  Patient Location: PACU  Anesthesia Type: General  Level of Consciousness: awake, alert  and patient cooperative  Airway and Oxygen Therapy: Patient Spontanous Breathing and Patient connected to supplemental oxygen  Post-op Assessment: Post-op Vital signs reviewed, Patient's Cardiovascular Status Stable, Respiratory Function Stable, Patent Airway and No signs of Nausea or vomiting  Post-op Vital Signs: Reviewed and stable  Complications: No apparent anesthesia complications

## 2018-09-29 NOTE — Anesthesia Postprocedure Evaluation (Signed)
Anesthesia Post Note  Patient: Caitlin House  Procedure(s) Performed: KNEE ARTHROSCOPY WITH PARTIAL MEDIAL AND LATERAL MENISECTOMY SYNOVECTEMY (Right Knee)  Patient location during evaluation: PACU Anesthesia Type: General Level of consciousness: awake and alert Pain management: pain level controlled Vital Signs Assessment: post-procedure vital signs reviewed and stable Respiratory status: spontaneous breathing, nonlabored ventilation, respiratory function stable and patient connected to nasal cannula oxygen Cardiovascular status: blood pressure returned to baseline and stable Postop Assessment: no apparent nausea or vomiting Anesthetic complications: no    Alisa Graff

## 2018-09-30 ENCOUNTER — Encounter: Payer: Self-pay | Admitting: Orthopedic Surgery

## 2018-12-30 ENCOUNTER — Other Ambulatory Visit: Payer: Self-pay | Admitting: Internal Medicine

## 2018-12-30 DIAGNOSIS — Z1231 Encounter for screening mammogram for malignant neoplasm of breast: Secondary | ICD-10-CM

## 2019-02-08 ENCOUNTER — Ambulatory Visit
Admission: RE | Admit: 2019-02-08 | Discharge: 2019-02-08 | Disposition: A | Discharge status: Home / Self Care | Payer: Medicare Other | Source: Ambulatory Visit | Attending: Internal Medicine | Admitting: Internal Medicine

## 2019-02-08 ENCOUNTER — Encounter (INDEPENDENT_AMBULATORY_CARE_PROVIDER_SITE_OTHER): Payer: Self-pay

## 2019-02-08 ENCOUNTER — Other Ambulatory Visit: Payer: Self-pay

## 2019-02-08 DIAGNOSIS — Z1231 Encounter for screening mammogram for malignant neoplasm of breast: Secondary | ICD-10-CM

## 2019-12-29 ENCOUNTER — Other Ambulatory Visit: Payer: Self-pay | Admitting: Internal Medicine

## 2019-12-29 DIAGNOSIS — Z1231 Encounter for screening mammogram for malignant neoplasm of breast: Secondary | ICD-10-CM

## 2020-02-09 ENCOUNTER — Other Ambulatory Visit: Payer: Self-pay

## 2020-02-09 ENCOUNTER — Ambulatory Visit
Admission: RE | Admit: 2020-02-09 | Discharge: 2020-02-09 | Disposition: A | Discharge status: Home / Self Care | Payer: Medicare Other | Source: Ambulatory Visit | Attending: Internal Medicine | Admitting: Internal Medicine

## 2020-02-09 DIAGNOSIS — Z1231 Encounter for screening mammogram for malignant neoplasm of breast: Secondary | ICD-10-CM | POA: Diagnosis present

## 2020-06-22 ENCOUNTER — Encounter: Payer: Self-pay | Admitting: Emergency Medicine

## 2020-06-22 ENCOUNTER — Emergency Department: Payer: Medicare Other

## 2020-06-22 ENCOUNTER — Other Ambulatory Visit: Payer: Self-pay

## 2020-06-22 ENCOUNTER — Emergency Department
Admission: EM | Admit: 2020-06-22 | Discharge: 2020-06-22 | Disposition: A | Discharge status: Home / Self Care | Payer: Medicare Other | Attending: Emergency Medicine | Admitting: Emergency Medicine

## 2020-06-22 DIAGNOSIS — J329 Chronic sinusitis, unspecified: Secondary | ICD-10-CM | POA: Diagnosis not present

## 2020-06-22 DIAGNOSIS — J441 Chronic obstructive pulmonary disease with (acute) exacerbation: Secondary | ICD-10-CM | POA: Diagnosis not present

## 2020-06-22 DIAGNOSIS — R0602 Shortness of breath: Secondary | ICD-10-CM

## 2020-06-22 DIAGNOSIS — Z87891 Personal history of nicotine dependence: Secondary | ICD-10-CM | POA: Diagnosis not present

## 2020-06-22 DIAGNOSIS — Z79899 Other long term (current) drug therapy: Secondary | ICD-10-CM | POA: Insufficient documentation

## 2020-06-22 DIAGNOSIS — J45909 Unspecified asthma, uncomplicated: Secondary | ICD-10-CM | POA: Insufficient documentation

## 2020-06-22 DIAGNOSIS — J449 Chronic obstructive pulmonary disease, unspecified: Secondary | ICD-10-CM | POA: Insufficient documentation

## 2020-06-22 DIAGNOSIS — I10 Essential (primary) hypertension: Secondary | ICD-10-CM | POA: Insufficient documentation

## 2020-06-22 LAB — CBC WITH DIFFERENTIAL/PLATELET
Abs Immature Granulocytes: 0.02 10*3/uL (ref 0.00–0.07)
Basophils Absolute: 0.1 10*3/uL (ref 0.0–0.1)
Basophils Relative: 1 %
Eosinophils Absolute: 0.2 10*3/uL (ref 0.0–0.5)
Eosinophils Relative: 2 %
HCT: 37 % (ref 36.0–46.0)
Hemoglobin: 12.5 g/dL (ref 12.0–15.0)
Immature Granulocytes: 0 %
Lymphocytes Relative: 43 %
Lymphs Abs: 3.1 10*3/uL (ref 0.7–4.0)
MCH: 31 pg (ref 26.0–34.0)
MCHC: 33.8 g/dL (ref 30.0–36.0)
MCV: 91.8 fL (ref 80.0–100.0)
Monocytes Absolute: 0.6 10*3/uL (ref 0.1–1.0)
Monocytes Relative: 8 %
Neutro Abs: 3.3 10*3/uL (ref 1.7–7.7)
Neutrophils Relative %: 46 %
Platelets: 213 10*3/uL (ref 150–400)
RBC: 4.03 MIL/uL (ref 3.87–5.11)
RDW: 12.1 % (ref 11.5–15.5)
WBC: 7.2 10*3/uL (ref 4.0–10.5)
nRBC: 0 % (ref 0.0–0.2)

## 2020-06-22 LAB — COMPREHENSIVE METABOLIC PANEL
ALT: 15 U/L (ref 0–44)
AST: 29 U/L (ref 15–41)
Albumin: 4.2 g/dL (ref 3.5–5.0)
Alkaline Phosphatase: 63 U/L (ref 38–126)
Anion gap: 9 (ref 5–15)
BUN: 28 mg/dL — ABNORMAL HIGH (ref 8–23)
CO2: 25 mmol/L (ref 22–32)
Calcium: 9.5 mg/dL (ref 8.9–10.3)
Chloride: 102 mmol/L (ref 98–111)
Creatinine, Ser: 1.2 mg/dL — ABNORMAL HIGH (ref 0.44–1.00)
GFR, Estimated: 48 mL/min — ABNORMAL LOW (ref >60)
Glucose, Bld: 110 mg/dL — ABNORMAL HIGH (ref 70–99)
Potassium: 3.5 mmol/L (ref 3.5–5.1)
Sodium: 136 mmol/L (ref 135–145)
Total Bilirubin: 0.8 mg/dL (ref 0.3–1.2)
Total Protein: 7.2 g/dL (ref 6.5–8.1)

## 2020-06-22 LAB — TROPONIN I (HIGH SENSITIVITY): Troponin I (High Sensitivity): 3 ng/L (ref <18)

## 2020-06-22 LAB — PROCALCITONIN: Procalcitonin: <0.1 ng/mL

## 2020-06-22 MED ORDER — AMOXICILLIN-POT CLAVULANATE 875-125 MG PO TABS: 1.0000 | ORAL_TABLET | Freq: Two times a day (BID) | ORAL | 0 refills | Status: AC

## 2020-06-22 MED ORDER — PREDNISONE 10 MG PO TABS: 40.0000 mg | ORAL_TABLET | Freq: Every day | ORAL | 0 refills | Status: AC

## 2020-06-22 NOTE — ED Provider Notes (Signed)
[facility] Emergency Department Provider Note  ____________________________________________   Event Date/Time   First MD Initiated Contact with Patient 06/22/20 [phone]     (approximate)  I have reviewed the triage vital signs and the nursing notes.   HISTORY  Chief Complaint Shortness of Breath   HPI Caitlin House is a 75 y.o. female with a past medical history of asthma, COPD, migraine headaches, HTN and remote tobacco abuse who presents for assessment of some shortness of breath she experienced tonight.  This is in the setting of 2 to 3 weeks of some congestion and mild nonproductive cough.  Patient states she completed a Z-Pak prescribed by her PCP for sinusitis but otherwise not been feeling any shortness of breath until this evening.  She states she took her home nebulizer and felt immediately better currently does not feel short of breath at all.  Other than endorsing persistent congestion cough no change in cough today patient denies any chest pain, fevers, headache, earache, sore throat abdominal pain, nausea, vomiting, diarrhea, dysuria, rash or recent falls or injuries or any other acute complaints.  States she currently feels "totally normal".  No other acute concerns at this time.         Past Medical History:  Diagnosis Date  . Asthma flare    with bad colds  . COPD (chronic obstructive pulmonary disease) (HCC)   . Frequent headaches    migraines.  none in 2 yrs  . Hepatitis    1960s. treated  . Hypertension   . Sinus problem   . Wears dentures    partial upper and lower    There are no problems to display for this patient.   Past Surgical History:  Procedure Laterality Date  . ABDOMINAL HYSTERECTOMY    . BREAST BIOPSY Right 1996   neg  . BREAST SURGERY Right   . DEEP NECK LYMPH NODE BIOPSY / EXCISION  01/2006  . GALLBLADDER SURGERY    . HEMORRHOID SURGERY    . KNEE ARTHROSCOPY WITH MEDIAL MENISECTOMY Right 09/29/2018    Procedure: KNEE ARTHROSCOPY WITH PARTIAL MEDIAL AND LATERAL MENISECTOMY SYNOVECTEMY;  Surgeon: Signa Kell, MD;  Location: [facility];  Service: Orthopedics;  Laterality: Right;  . TRIGGER FINGER RELEASE Right     Prior to Admission medications   Medication Sig Start Date End Date Taking? Authorizing Provider  amoxicillin-clavulanate (AUGMENTIN) 875-125 MG tablet Take 1 tablet by mouth 2 (two) times daily for 7 days. 06/22/20 06/29/20 Yes Gilles Chiquito, MD  predniSONE (DELTASONE) 10 MG tablet Take 4 tablets (40 mg total) by mouth daily for 4 days. 06/22/20 06/26/20 Yes Gilles Chiquito, MD  albuterol (ACCUNEB) 0.63 MG/3ML nebulizer solution Take 1 ampule by nebulization every 6 (six) hours as needed for wheezing.    [provider]  albuterol (PROVENTIL HFA) 108 (90 Base) MCG/ACT inhaler Inhale into the lungs. 01/19/16   [provider]  atenolol-chlorthalidone (TENORETIC) 50-25 MG tablet Take 0.5 tablets by mouth.  05/14/16   [provider]  butalbital-aspirin-caffeine Benny Lennert) 50-325-40 MG capsule Take by mouth. 05/13/16   [provider]  chlorpheniramine-HYDROcodone (TUSSIONEX PENNKINETIC ER) 10-8 MG/5ML SUER Take 5 mLs by mouth at bedtime as needed for cough. Patient not taking: Reported on 09/17/2018 09/07/17   Allegra Grana, FNP  Cholecalciferol (VITAMIN D3) 2000 units capsule Take by mouth.    [provider]  HYDROcodone-acetaminophen (NORCO) 5-325 MG tablet Take 1-2 tablets by mouth every 4 (four) hours  as needed for moderate pain or severe pain. 09/29/18   Signa KellPatel, Sunny, MD  ibuprofen (ADVIL) 800 MG tablet Take 800 mg by mouth every 8 (eight) hours as needed.    [provider]  lidocaine (XYLOCAINE) 2 % solution Use as directed 20 mLs in the mouth or throat every 3 (three) hours as needed for mouth pain (gargle; may spit or swallow). Patient not taking: Reported on 09/17/2018 09/07/17   Allegra GranaArnett, Margaret G, FNP  loratadine  (CLARITIN) 10 MG tablet Take 10 mg by mouth daily.    [provider]  losartan (COZAAR) 50 MG tablet Take by mouth. 10/28/16   [provider]  mometasone (NASONEX) 50 MCG/ACT nasal spray Place into the nose.    [provider]  ondansetron (ZOFRAN ODT) 4 MG disintegrating tablet Take 1 tablet (4 mg total) by mouth every 8 (eight) hours as needed for nausea or vomiting. 09/29/18   Signa KellPatel, Sunny, MD  pravastatin (PRAVACHOL) 20 MG tablet Take by mouth. 08/27/16   [provider]  vitamin B-12 (CYANOCOBALAMIN) 1000 MCG tablet Take by mouth.    [provider]    Allergies Venlafaxine, Sulfa antibiotics, Valacyclovir, and Lisinopril  Family History  Problem Relation Age of Onset  . Cancer Father   . Breast cancer Maternal Aunt     Social History Social History   Tobacco Use  . Smoking status: Former Smoker    Types: Cigarettes    Quit date: 07/09/1993    Years since quitting: 26.9  . Smokeless tobacco: Never Used  Vaping Use  . Vaping Use: Never used  Substance Use Topics  . Alcohol use: Not Currently    Comment: rarely  . Drug use: No    Review of Systems  Review of Systems  Constitutional: Negative for chills and fever.  HENT: Positive for congestion. Negative for sore throat.   Eyes: Negative for pain.  Respiratory: Positive for cough and shortness of breath. Negative for stridor.   Cardiovascular: Negative for chest pain.  Gastrointestinal: Negative for vomiting.  Skin: Negative for rash.  Neurological: Negative for seizures, loss of consciousness and headaches.  Psychiatric/Behavioral: Negative for suicidal ideas.  All other systems reviewed and are negative.     ____________________________________________   PHYSICAL EXAM:  VITAL SIGNS: ED Triage Vitals [06/22/20 0250]  Enc Vitals Group     BP (!) 188/74     Pulse Rate 81     Resp 18     Temp 97.8 F (36.6 C)     Temp Source Oral     SpO2 98 %     Weight 194 lb  (88 kg)     Height 5\' 3"  (1.6 m)     Head Circumference      Peak Flow      Pain Score 0     Pain Loc      Pain Edu?      Excl. in GC?    Vitals:   06/22/20 0430 06/22/20 0432  BP: (!) 173/75   Pulse: 66   Resp: 17   Temp:  97.8 F (36.6 C)  SpO2: 98%    Physical Exam Vitals and nursing note reviewed.  Constitutional:      General: She is not in acute distress.    Appearance: She is well-developed.  HENT:     Head: Normocephalic and atraumatic.     Right Ear: External ear normal.     Left Ear: External ear normal.  Nose: Nose normal.  Eyes:     Conjunctiva/sclera: Conjunctivae normal.  Cardiovascular:     Rate and Rhythm: Normal rate and regular rhythm.     Heart sounds: No murmur heard.   Pulmonary:     Effort: Pulmonary effort is normal. No respiratory distress.     Breath sounds: Normal breath sounds.  Abdominal:     Palpations: Abdomen is soft.     Tenderness: There is no abdominal tenderness.  Musculoskeletal:     Cervical back: Neck supple.  Skin:    General: Skin is warm and dry.     Capillary Refill: Capillary refill takes less than 2 seconds.  Neurological:     Mental Status: She is alert and oriented to person, place, and time.  Psychiatric:        Mood and Affect: Mood normal.     Oropharynx is unremarkable.  No significant lymphadenopathy.  No audible stridor.  Cranial nerves II to XII grossly intact ____________________________________________   LABS (all labs ordered are listed, but only abnormal results are displayed)  Labs Reviewed  COMPREHENSIVE METABOLIC PANEL - Abnormal; Notable for the following components:      Result Value   Glucose, Bld 110 (*)    BUN 28 (*)    Creatinine, Ser 1.20 (*)    GFR, Estimated 48 (*)    All other components within normal limits  CBC WITH DIFFERENTIAL/PLATELET  PROCALCITONIN  TROPONIN I (HIGH SENSITIVITY)  TROPONIN I (HIGH SENSITIVITY)    ____________________________________________  EKG  Dysrhythmia ventricular rate of 80, normal axis, unremarkable intervals and no clear evidence of acute ischemia or significant underlying arrhythmia. ____________________________________________  RADIOLOGY  ED MD interpretation: No clear for consolidation, effusion, significant IMA, pneumothorax or any other clear acute intrathoracic process.  Official radiology report(s): DG Chest 2 View  Result Date: 06/22/2020 CLINICAL DATA:  Shortness of breath and sinus drainage. EXAM: CHEST - 2 VIEW COMPARISON:  March 22, 2017 FINDINGS: The heart size and mediastinal contours are within normal limits. There is mild to moderate severity calcification of the aortic arch. Both lungs are clear. Radiopaque surgical clips are seen overlying the right upper quadrant. The visualized skeletal structures are unremarkable. IMPRESSION: No active cardiopulmonary disease. Electronically Signed   By: Aram Candela M.D.   On: 06/22/2020 03:23    ____________________________________________   PROCEDURES  Procedure(s) performed (including Critical Care):  .1-3 Lead EKG Interpretation Performed by: Gilles Chiquito, MD Authorized by: Gilles Chiquito, MD     Interpretation: normal     ECG rate assessment: normal     Rhythm: sinus rhythm     Ectopy: none     Conduction: normal       ____________________________________________   INITIAL IMPRESSION / ASSESSMENT AND PLAN / ED COURSE        Patient presents with above to history exam for assessment of some shortness of breath in the setting of 2 or 3 weeks of nonproductive cough and congestion.  She did take a nebulizer treatment prior to arrival and states she currently is asymptomatic.  On arrival she is hypertensive with BP of 188/74 with stable vital signs on room air.  Suspect mild COPD exacerbation and some persistent sinusitis. Advised patient that her blood pressure was slightly  elevated and that she did not have this rechecked by her PCP was not anxious experiencing acute hypertensive emergency at this time.  Overall very low suspicion for PE as patient is not tachycardic hypoxic or tachypneic and  denies any symptoms on arrival and it seems her shortness of breath resolved after neb treatment.  Very low suspicion for ACS given reassuring EKG nonelevated opponent patiently denying any chest pain.  Chest x-ray has no evidence of pneumonia, thorax, effusion, edema or any other clear acute thoracic process.  CBC has no leukocytosis or acute anemia that explain patient's shortness of breath.  CMP is no significant electrolyte or metabolic derangements.  Given stable vitals otherwise reassuring exam and work-up and no significant wheezing auscultated on exam do not believe patient requires treatment emergency room but will write short course of prednisone and Augmentin to cover possible persistent bacterial sinusitis and mild COPD exacerbation.  Patient does not require refill of her nebulizer treatments at this time.  Discharged stable condition.  Strict return precautions advised and discussed.       ____________________________________________   FINAL CLINICAL IMPRESSION(S) / ED DIAGNOSES  Final diagnoses:  SOB (shortness of breath)  COPD exacerbation (HCC)  Sinusitis, unspecified chronicity, unspecified location    Medications - No data to display   ED Discharge Orders         Ordered    amoxicillin-clavulanate (AUGMENTIN) 875-125 MG tablet  2 times daily        06/22/20 0430    predniSONE (DELTASONE) 10 MG tablet  Daily        06/22/20 0430           Note:  This document was prepared using Dragon voice recognition software and may include unintentional dictation errors.   Gilles Chiquito, MD 06/22/20 [phone]

## 2020-06-22 NOTE — ED Triage Notes (Signed)
Patient ambulatory to triage with steady gait, without difficulty or distress noted; pt reports SHOB tonight; neb used PTA; recent sinus drainage, zpak completed; denies pain or cough

## 2021-01-08 ENCOUNTER — Other Ambulatory Visit: Payer: Self-pay | Admitting: Internal Medicine

## 2021-01-08 DIAGNOSIS — Z1231 Encounter for screening mammogram for malignant neoplasm of breast: Secondary | ICD-10-CM

## 2021-01-17 ENCOUNTER — Ambulatory Visit
Admission: EM | Admit: 2021-01-17 | Discharge: 2021-01-17 | Disposition: A | Discharge status: Home / Self Care | Payer: Medicare Other | Attending: Emergency Medicine | Admitting: Emergency Medicine

## 2021-01-17 ENCOUNTER — Other Ambulatory Visit: Payer: Self-pay

## 2021-01-17 DIAGNOSIS — Z20822 Contact with and (suspected) exposure to covid-19: Secondary | ICD-10-CM | POA: Diagnosis present

## 2021-01-17 DIAGNOSIS — J069 Acute upper respiratory infection, unspecified: Secondary | ICD-10-CM | POA: Diagnosis present

## 2021-01-17 LAB — RESP PANEL BY RT-PCR (FLU A&B, COVID) ARPGX2
Influenza A by PCR: NEGATIVE
Influenza B by PCR: NEGATIVE
SARS Coronavirus 2 by RT PCR: NEGATIVE

## 2021-01-17 MED ORDER — PREDNISONE 20 MG PO TABS: 40.0000 mg | ORAL_TABLET | Freq: Every day | ORAL | 0 refills | Status: AC

## 2021-01-17 MED ORDER — AMOXICILLIN-POT CLAVULANATE 875-125 MG PO TABS: 1.0000 | ORAL_TABLET | Freq: Two times a day (BID) | ORAL | 0 refills | Status: AC

## 2021-01-17 MED ORDER — AEROCHAMBER PLUS MISC: 2 refills | Status: AC

## 2021-01-17 NOTE — ED Triage Notes (Signed)
Pt here with C/O cough, chest congestion for 5 days (Friday). Is coughing green phlegm.

## 2021-01-17 NOTE — Discharge Instructions (Addendum)
We are checking a COVID and flu.  I will contact you if either 1 of these are positive and will prescribe Molnupiravir if your COVID is positive.  Today is the last day that you can start antivirals.  You are out of the window for treatment for influenza.  Continue Mucinex, start saline nasal irrigation, Flonase, finish prednisone unless Dr. Hyacinth Meeker tells you to stop.  Use a spacer with the albuterol inhaler.  2 puffs every 4-6 hours as needed for coughing, wheezing.  Wait-and-see prescription of Augmentin-if your COVID is negative, then go ahead and start it.  This will treat a presumed COPD exacerbation.  If your COVID is positive, then do not fill it.

## 2021-01-17 NOTE — ED Provider Notes (Signed)
HPI  SUBJECTIVE:  Caitlin House is a 75 y.o. female who presents with cough productive of greenish phlegm, thick, green rhinorrhea, postnasal drip, wheezing at night.  She reports sore throat that resolves after expectorating some mucus.  No fevers, body aches, headaches, nasal congestion, loss of sense of smell or taste, chest pain, shortness of breath, nausea, vomiting, diarrhea, abdominal pain, sinus pain or pressure.  No known COVID or flu exposure.  She got the COVID and flu vaccines.  No antibiotics in the past 3 months.  She states that she is able to sleep at night without waking up coughing.  She has taken 3 doses of leftover amoxicillin so far, Mucinex, Mucinex cough medicine with improvement in her symptoms.  No aggravating factors.  She states that she had similar symptoms before when she had COVID.  She has a past medical history of asthma/COPD, hypertension, COVID and 2020.  She is not a smoker, no history of diabetes, chronic kidney disease.  HWE:[id number], Hardin Negus, MD   Past Medical History:  Diagnosis Date   Asthma flare    with bad colds   COPD (chronic obstructive pulmonary disease) (HCC)    Frequent headaches    migraines.  none in 2 yrs   Hepatitis    1960s. treated   Hypertension    Sinus problem    Wears dentures    partial upper and lower    Past Surgical History:  Procedure Laterality Date   ABDOMINAL HYSTERECTOMY     BREAST BIOPSY Right 1996   neg   BREAST SURGERY Right    DEEP NECK LYMPH NODE BIOPSY / EXCISION  01/2006   GALLBLADDER SURGERY     HEMORRHOID SURGERY     KNEE ARTHROSCOPY WITH MEDIAL MENISECTOMY Right 09/29/2018   Procedure: KNEE ARTHROSCOPY WITH PARTIAL MEDIAL AND LATERAL MENISECTOMY SYNOVECTEMY;  Surgeon: Signa Kell, MD;  Location: [facility];  Service: Orthopedics;  Laterality: Right;   TRIGGER FINGER RELEASE Right     Family History  Problem Relation Age of Onset   Cancer Father    Breast cancer Maternal Aunt      Social History   Tobacco Use   Smoking status: Former    Types: Cigarettes    Quit date: 07/09/1993    Years since quitting: 27.5   Smokeless tobacco: Never  Vaping Use   Vaping Use: Never used  Substance Use Topics   Alcohol use: Not Currently    Comment: rarely   Drug use: No    No current facility-administered medications for this encounter.  Current Outpatient Medications:    albuterol (ACCUNEB) 0.63 MG/3ML nebulizer solution, Take 1 ampule by nebulization every 6 (six) hours as needed for wheezing., Disp: , Rfl:    albuterol (VENTOLIN HFA) 108 (90 Base) MCG/ACT inhaler, Inhale into the lungs., Disp: , Rfl:    amoxicillin-clavulanate (AUGMENTIN) 875-125 MG tablet, Take 1 tablet by mouth 2 (two) times daily for 7 days., Disp: 14 tablet, Rfl: 0   atenolol-chlorthalidone (TENORETIC) 50-25 MG tablet, Take 0.5 tablets by mouth. , Disp: , Rfl:    butalbital-aspirin-caffeine (FIORINAL) 50-325-40 MG capsule, Take by mouth., Disp: , Rfl:    Cholecalciferol (VITAMIN D3) 2000 units capsule, Take by mouth., Disp: , Rfl:    losartan (COZAAR) 50 MG tablet, Take by mouth., Disp: , Rfl:    mometasone (NASONEX) 50 MCG/ACT nasal spray, Place into the nose., Disp: , Rfl:    pravastatin (PRAVACHOL) 20 MG tablet, Take by mouth., Disp: ,  Rfl:    predniSONE (DELTASONE) 20 MG tablet, Take 2 tablets (40 mg total) by mouth daily with breakfast for 5 days., Disp: 10 tablet, Rfl: 0   Spacer/Aero-Holding Chambers (AEROCHAMBER PLUS) inhaler, Use with inhaler, Disp: 1 each, Rfl: 2   vitamin B-12 (CYANOCOBALAMIN) 1000 MCG tablet, Take by mouth., Disp: , Rfl:   Allergies  Allergen Reactions   Venlafaxine Nausea Only   Sulfa Antibiotics     Liver issues   Valacyclovir Nausea And Vomiting   Lisinopril Cough     ROS  As noted in HPI.   Physical Exam  BP (!) 165/95 (BP Location: Left Arm)   Pulse 65   Temp 98.4 F (36.9 C) (Oral)   Resp 18   Ht 5\' 4"  (1.626 m)   Wt 88 kg   SpO2 98%   BMI  33.30 kg/m   Constitutional: Well developed, well nourished, no acute distress Eyes:  EOMI, conjunctiva normal bilaterally HENT: Normocephalic, atraumatic,mucus membranes moist.  Mild nasal congestion.  Normal turbinates.  No maxillary, frontal sinus tenderness.  No obvious postnasal drip. Respiratory: Normal inspiratory effort, lungs clear bilaterally, good air movement.  No anterior, lateral chest wall tenderness Cardiovascular: Normal rate, regular rhythm, no murmurs rubs or gallops. GI: nondistended skin: No rash, skin intact Musculoskeletal: no deformities Neurologic: Alert & oriented x 3, no focal neuro deficits Psychiatric: Speech and behavior appropriate   ED Course   Medications - No data to display  Orders Placed This Encounter  Procedures   Resp Panel by RT-PCR (Flu A&B, Covid) Nasopharyngeal Swab    Standing Status:   Standing    Number of Occurrences:   1   Airborne and Contact precautions    Standing Status:   Standing    Number of Occurrences:   1    Results for orders placed or performed during the hospital encounter of 01/17/21 (from the past 24 hour(s))  Resp Panel by RT-PCR (Flu A&B, Covid) Nasopharyngeal Swab     Status: None   Collection Time: 01/17/21  4:22 PM   Specimen: Nasopharyngeal Swab; Nasopharyngeal(NP) swabs in vial transport medium  Result Value Ref Range   SARS Coronavirus 2 by RT PCR NEGATIVE NEGATIVE   Influenza A by PCR NEGATIVE NEGATIVE   Influenza B by PCR NEGATIVE NEGATIVE   No results found.  ED Clinical Impression  1. Acute upper respiratory infection   2. Encounter for laboratory testing for COVID-19 virus      ED Assessment/Plan  Patient's lungs are clear, she is afebrile, has not had any fevers.  Her vitals are normal.  Doubt pneumonia  thus deferring chest x-ray today.  She states she has similar symptoms last time she had COVID.  Today's last day that she can be started on COVID antivirals, so checking a COVID/flu PCR.   Unfortunately, she is out of the window for treatment with Tamiflu..  Will prescribe a spacer for her albuterol inhaler, she does not need another albuterol inhaler.  Sending home with prednisone 40 mg for 5 days, continue Mucinex, start Flonase and saline nasal irrigation.  Sending home with Augmentin for a presumed COPD exacerbation as she has already started treating herself with some leftover amoxicillin.  COVID, influenza PCR negative.  Plan as above.  Discussed labs, MDM, treatment plan, and plan for follow-up with patient. Discussed sn/sx that should prompt return to the ED. patient agrees with plan.   Meds ordered this encounter  Medications   amoxicillin-clavulanate (AUGMENTIN) 875-125 MG tablet  Sig: Take 1 tablet by mouth 2 (two) times daily for 7 days.    Dispense:  14 tablet    Refill:  0   Spacer/Aero-Holding Chambers (AEROCHAMBER PLUS) inhaler    Sig: Use with inhaler    Dispense:  1 each    Refill:  2    Please educate patient on use   predniSONE (DELTASONE) 20 MG tablet    Sig: Take 2 tablets (40 mg total) by mouth daily with breakfast for 5 days.    Dispense:  10 tablet    Refill:  0      *This clinic note was created using Scientist, clinical (histocompatibility and immunogenetics). Therefore, there may be occasional mistakes despite careful proofreading.  ?    Domenick Gong, MD 01/18/21 [phone]

## 2021-02-12 ENCOUNTER — Ambulatory Visit
Admission: RE | Admit: 2021-02-12 | Discharge: 2021-02-12 | Disposition: A | Discharge status: Home / Self Care | Payer: Medicare Other | Source: Ambulatory Visit | Attending: Internal Medicine | Admitting: Internal Medicine

## 2021-02-12 ENCOUNTER — Other Ambulatory Visit: Payer: Self-pay

## 2021-02-12 DIAGNOSIS — Z1231 Encounter for screening mammogram for malignant neoplasm of breast: Secondary | ICD-10-CM | POA: Insufficient documentation

## 2021-07-06 ENCOUNTER — Ambulatory Visit
Admission: EM | Admit: 2021-07-06 | Discharge: 2021-07-06 | Disposition: A | Discharge status: Home / Self Care | Payer: Medicare Other | Attending: Emergency Medicine | Admitting: Emergency Medicine

## 2021-07-06 DIAGNOSIS — J069 Acute upper respiratory infection, unspecified: Secondary | ICD-10-CM

## 2021-07-06 MED ORDER — PROMETHAZINE-DM 6.25-15 MG/5ML PO SYRP: 2.5000 mL | ORAL_SOLUTION | Freq: Four times a day (QID) | ORAL | 0 refills | Status: DC | PRN

## 2021-07-06 MED ORDER — AMOXICILLIN-POT CLAVULANATE 875-125 MG PO TABS: 1.0000 | ORAL_TABLET | Freq: Two times a day (BID) | ORAL | 0 refills | Status: DC

## 2021-07-06 MED ORDER — BENZONATATE 100 MG PO CAPS: 100.0000 mg | ORAL_CAPSULE | Freq: Three times a day (TID) | ORAL | 0 refills | Status: DC

## 2021-07-06 NOTE — ED Provider Notes (Signed)
?[facility] ? ? ? ?CSN: [id number] ?Arrival date & time: 07/06/21  1031 ? ? ?  ? ?History   ?Chief Complaint ?Chief Complaint  ?Patient presents with  ? Cough  ? Nasal Congestion  ? ? ?HPI ?Caitlin House is a 76 y.o. female.  ? ?Presents with sore throat, nasal congestion, rhinorrhea and a productive cough for 1 week.  Sore throat has resolved.  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  Tolerating food and liquids.  Has attempted use of Mucinex and amoxicillin tablets that she got from her husband which have been minimally effective.  History of asthma, COPD.  Denies shortness of breath, wheezing, chest pain or tightness, fever, chills or body aches.   ? ?Past Medical History:  ?Diagnosis Date  ? Asthma flare   ? with bad colds  ? COPD (chronic obstructive pulmonary disease) (HCC)   ? Frequent headaches   ? migraines.  none in 2 yrs  ? Hepatitis   ? 1960s. treated  ? Hypertension   ? Sinus problem   ? Wears dentures   ? partial upper and lower  ? ? ?There are no problems to display for this patient. ? ? ?Past Surgical History:  ?Procedure Laterality Date  ? ABDOMINAL HYSTERECTOMY    ? BREAST BIOPSY Right 1996  ? neg  ? BREAST SURGERY Right   ? DEEP NECK LYMPH NODE BIOPSY / EXCISION  01/2006  ? GALLBLADDER SURGERY    ? HEMORRHOID SURGERY    ? KNEE ARTHROSCOPY WITH MEDIAL MENISECTOMY Right 09/29/2018  ? Procedure: KNEE ARTHROSCOPY WITH PARTIAL MEDIAL AND LATERAL MENISECTOMY SYNOVECTEMY;  Surgeon: Signa KellPatel, Sunny, MD;  [facility];  Service: Orthopedics;  Laterality: Right;  ? TRIGGER FINGER RELEASE Right   ? ? ?OB History   ?No obstetric history on file. ?  ? ? ? ?Home Medications   ? ?Prior to Admission medications   ?Medication Sig Start Date End Date Taking? Authorizing Provider  ?albuterol (ACCUNEB) 0.63 MG/3ML nebulizer solution Take 1 ampule by nebulization every 6 (six) hours as needed for wheezing.   Yes [provider]  ?albuterol (VENTOLIN HFA) 108 (90  Base) MCG/ACT inhaler Inhale into the lungs. 01/19/16  Yes [provider]  ?atenolol-chlorthalidone (TENORETIC) 50-25 MG tablet Take 0.5 tablets by mouth.  05/14/16  Yes [provider]  ?butalbital-aspirin-caffeine Benny Lennert(FIORINAL) 50-325-40 MG capsule Take by mouth. 05/13/16  Yes [provider]  ?Cholecalciferol (VITAMIN D3) 2000 units capsule Take by mouth.   Yes [provider]  ?losartan (COZAAR) 50 MG tablet Take by mouth. 10/28/16  Yes [provider]  ?mometasone (NASONEX) 50 MCG/ACT nasal spray Place into the nose.   Yes [provider]  ?pravastatin (PRAVACHOL) 20 MG tablet Take by mouth. 08/27/16  Yes [provider]  ?Spacer/Aero-Holding Chambers (AEROCHAMBER PLUS) inhaler Use with inhaler 01/17/21  Yes Domenick GongMortenson, Ashley, MD  ?vitamin B-12 (CYANOCOBALAMIN) 1000 MCG tablet Take by mouth.   Yes [provider]  ? ? ?Family History ?Family History  ?Problem Relation Age of Onset  ? Cancer Father   ? Breast cancer Maternal Aunt   ? ? ?Social History ?Social History  ? ?Tobacco Use  ? Smoking status: Former  ?  Types: Cigarettes  ?  Quit date: 07/09/1993  ?  Years since quitting: 28.0  ? Smokeless tobacco: Never  ?Vaping Use  ? Vaping Use: Never used  ?Substance Use Topics  ? Alcohol use: Not Currently  ?  Comment: rarely  ? Drug use: No  ? ? ? ?Allergies   ?Venlafaxine, Sulfa antibiotics, Valacyclovir, and Lisinopril ? ? ?Review of Systems ?Review of Systems  ?Constitutional: Negative.   ?HENT:  Positive for congestion, rhinorrhea and sore throat. Negative for dental problem, drooling, ear discharge, ear pain, facial swelling, hearing loss, mouth sores, nosebleeds, postnasal drip, sinus pressure, sinus pain, sneezing, tinnitus, trouble swallowing and voice change.   ?Respiratory:  Positive for cough. Negative for apnea, choking, chest tightness, shortness of breath, wheezing and stridor.   ?Cardiovascular: Negative.   ?Gastrointestinal: Negative.    ?Skin: Negative.   ?Neurological: Negative.   ? ? ?Physical Exam ?Triage Vital Signs ?ED Triage Vitals  ?Enc Vitals Group  ?   BP 07/06/21 1042 (!) 173/71  ?   Pulse Rate 07/06/21 1042 71  ?   Resp 07/06/21 1042 18  ?   Temp 07/06/21 1042 98.4 ?F (36.9 ?C)  ?   Temp Source 07/06/21 1042 Oral  ?   SpO2 07/06/21 1042 99 %  ?   Weight 07/06/21 1041 192 lb (87.1 kg)  ?   Height --   ?   Head Circumference --   ?   Peak Flow --   ?   Pain Score 07/06/21 1041 0  ?   Pain Loc --   ?   Pain Edu? --   ?   Excl. in GC? --   ? ?No data found. ? ?Updated Vital Signs ?BP (!) 173/71 (BP Location: Left Arm)   Pulse 71   Temp 98.4 ?F (36.9 ?C) (Oral)   Resp 18   Wt 192 lb (87.1 kg)   SpO2 99%   BMI 32.96 kg/m?  ? ?Visual Acuity ?Right Eye Distance:   ?Left Eye Distance:   ?Bilateral Distance:   ? ?Right Eye Near:   ?Left Eye Near:    ?Bilateral Near:    ? ?Physical Exam ?Constitutional:   ?   Appearance: Normal appearance.  ?HENT:  ?   Head: Normocephalic.  ?   Right Ear: Tympanic membrane, ear canal and external ear normal.  ?   Left Ear: Tympanic membrane, ear canal and external ear normal.  ?   Nose: Congestion and rhinorrhea present.  ?   Mouth/Throat:  ?   Mouth: Mucous membranes are moist.  ?   Pharynx: Oropharynx is clear.  ?Eyes:  ?   Extraocular Movements: Extraocular movements intact.  ?Cardiovascular:  ?   Rate and Rhythm: Normal rate and regular rhythm.  ?   Pulses: Normal pulses.  ?   Heart sounds: Normal heart sounds.  ?Pulmonary:  ?   Effort: Pulmonary effort is normal.  ?   Breath sounds: Normal breath sounds.  ?Skin: ?   General: Skin is warm and dry.  ?Neurological:  ?   Mental Status: She is alert and oriented to person, place, and time. Mental status is at baseline.  ?Psychiatric:     ?   Mood and Affect: Mood normal.     ?   Behavior: Behavior normal.  ? ? ? ?UC Treatments / Results  ?Labs ?(all labs ordered are listed, but only abnormal results are displayed) ?Labs Reviewed - No data to  display ? ?EKG ? ? ?Radiology ?No results found. ? ?Procedures ?Procedures (including critical care time) ? ?Medications Ordered in UC ?Medications - No data to display ? ?Initial Impression / Assessment and Plan / UC Course  ?I have reviewed the triage vital signs  and the nursing notes. ? ?Pertinent labs & imaging results that were available during my care of the patient were reviewed by me and considered in my medical decision making (see chart for details). ? ?Upper respiratory tract infection ? ?Vital signs are stable, patient is in no signs of distress, O2 saturation 99% on room air, lungs clear to to auscultation, low suspicion for pneumonia, bronchitis or pneumothorax, as symptoms have persisted with minimal improvement, will begin bacterial coverage due to your medical history, Augmentin 7-day course prescribed as well as Tessalon and Promethazine DM for management of coughing, patient may continue use of over-the-counter medications for additional comfort, may follow-up with urgent care or PCP for persisting or worsening symptoms ?Final Clinical Impressions(s) / UC Diagnoses  ? ?Final diagnoses:  ?None  ? ?Discharge Instructions   ?None ?  ? ?ED Prescriptions   ?None ?  ? ?PDMP not reviewed this encounter. ?  ?Valinda Hoar, NP ?07/06/21 1104 ? ?

## 2021-07-06 NOTE — Discharge Instructions (Signed)
Your symptoms today are most likely being caused by a virus and should steadily improve in time however due to your symptoms persisting to 7 days with minimal improvement and your medical history we will move forward with bacterial coverage ? ?Take Augmentin twice daily for the next 7 days ? ?You may use Tessalon pill every 8 hours as needed to help calm coughing ? ?You may use cough syrup for additional comfort every 6 hours, be mindful this medicine has the potential to make you feel sleepy ? ?If your insurance is unable to cover your cough medicine, you may look for a coupon on  ?[email or web address] ?   ?You can take Tylenol and/or Ibuprofen as needed for fever reduction and pain relief. ?  ?For cough: honey 1/2 to 1 teaspoon (you can dilute the honey in water or another fluid).  You can also use guaifenesin and dextromethorphan for cough. You can use a humidifier for chest congestion and cough.  If you don't have a humidifier, you can sit in the bathroom with the hot shower running.    ?  ?For sore throat: try warm salt water gargles, cepacol lozenges, throat spray, warm tea or water with lemon/honey, popsicles or ice, or OTC cold relief medicine for throat discomfort. ?  ?For congestion: take a daily anti-histamine like Zyrtec, Claritin, and a oral decongestant, such as pseudoephedrine.  You can also use Flonase 1-2 sprays in each nostril daily. ?  ?It is important to stay hydrated: drink plenty of fluids (water, gatorade/powerade/pedialyte, juices, or teas) to keep your throat moisturized and help further relieve irritation/discomfort.  ?

## 2021-07-06 NOTE — ED Triage Notes (Signed)
Pt c/o sore throat, cough with yellow phlegm, nasal drainage, congestion x1week ?

## 2021-07-08 ENCOUNTER — Ambulatory Visit (INDEPENDENT_AMBULATORY_CARE_PROVIDER_SITE_OTHER): Payer: Medicare Other

## 2021-07-08 ENCOUNTER — Encounter: Payer: Self-pay | Admitting: Emergency Medicine

## 2021-07-08 ENCOUNTER — Ambulatory Visit
Admission: EM | Admit: 2021-07-08 | Discharge: 2021-07-08 | Disposition: A | Discharge status: Home / Self Care | Payer: Medicare Other | Attending: Physician Assistant | Admitting: Physician Assistant

## 2021-07-08 ENCOUNTER — Other Ambulatory Visit: Payer: Self-pay

## 2021-07-08 DIAGNOSIS — R0989 Other specified symptoms and signs involving the circulatory and respiratory systems: Secondary | ICD-10-CM | POA: Diagnosis not present

## 2021-07-08 DIAGNOSIS — R051 Acute cough: Secondary | ICD-10-CM

## 2021-07-08 DIAGNOSIS — R0981 Nasal congestion: Secondary | ICD-10-CM | POA: Diagnosis not present

## 2021-07-08 DIAGNOSIS — R059 Cough, unspecified: Secondary | ICD-10-CM

## 2021-07-08 DIAGNOSIS — J441 Chronic obstructive pulmonary disease with (acute) exacerbation: Secondary | ICD-10-CM

## 2021-07-08 MED ORDER — CHERATUSSIN AC 100-10 MG/5ML PO SOLN: 10.0000 mL | Freq: Three times a day (TID) | ORAL | 0 refills | Status: DC | PRN

## 2021-07-08 MED ORDER — PREDNISONE 20 MG PO TABS: 40.0000 mg | ORAL_TABLET | Freq: Every day | ORAL | 0 refills | Status: AC

## 2021-07-08 NOTE — ED Provider Notes (Signed)
?[facility] ? ? ? ?CSN: [id number] ?Arrival date & time: 07/08/21  1259 ? ? ?  ? ?History   ?Chief Complaint ?Chief Complaint  ?Patient presents with  ? Cough  ? ? ?HPI ?Caitlin House is a 76 y.o. female with history of COPD/emphysema.  Patient also has history of asthma, hypertension and sinusitis chronically.  Patient presents today for continued cough and URI symptoms.  Patient reports being sick for about a week and a half now.  She was seen here 2 days ago for the symptoms and diagnosed with a upper respiratory infection and treated with Augmentin, benzonatate and Promethazine DM.  Patient reports that she feels a little better than she did a couple days ago but is concerned because her cough is still productive of yellowish-white thick sputum that is blood-tinged.  Patient denies any increased shortness of breath.  She has had some wheezing but has used her inhalers and feels that she is breathing fine.  She says that her nose is consistently running.  Denies facial pain or significant congestion.  No other complaints. ? ?HPI ? ?Past Medical History:  ?Diagnosis Date  ? Asthma flare   ? with bad colds  ? COPD (chronic obstructive pulmonary disease) (HCC)   ? Frequent headaches   ? migraines.  none in 2 yrs  ? Hepatitis   ? 1960s. treated  ? Hypertension   ? Sinus problem   ? Wears dentures   ? partial upper and lower  ? ? ?There are no problems to display for this patient. ? ? ?Past Surgical History:  ?Procedure Laterality Date  ? ABDOMINAL HYSTERECTOMY    ? BREAST BIOPSY Right 1996  ? neg  ? BREAST SURGERY Right   ? DEEP NECK LYMPH NODE BIOPSY / EXCISION  01/2006  ? GALLBLADDER SURGERY    ? HEMORRHOID SURGERY    ? KNEE ARTHROSCOPY WITH MEDIAL MENISECTOMY Right 09/29/2018  ? Procedure: KNEE ARTHROSCOPY WITH PARTIAL MEDIAL AND LATERAL MENISECTOMY SYNOVECTEMY;  Surgeon: Signa KellPatel, Sunny, MD;  [facility];  Service: Orthopedics;  Laterality: Right;  ? TRIGGER FINGER RELEASE  Right   ? ? ?OB History   ?No obstetric history on file. ?  ? ? ? ?Home Medications   ? ?Prior to Admission medications   ?Medication Sig Start Date End Date Taking? Authorizing Provider  ?amoxicillin-clavulanate (AUGMENTIN) 875-125 MG tablet Take 1 tablet by mouth every 12 (twelve) hours. 07/06/21  Yes White, Adrienne R, NP  ?atenolol-chlorthalidone (TENORETIC) 50-25 MG tablet Take 0.5 tablets by mouth.  05/14/16  Yes [provider]  ?guaiFENesin-codeine (CHERATUSSIN AC) 100-10 MG/5ML syrup Take 10 mLs by mouth 3 (three) times daily as needed for cough. 07/08/21  Yes Eusebio FriendlyEaves, Tyquon Near B, PA-C  ?pravastatin (PRAVACHOL) 20 MG tablet Take by mouth. 08/27/16  Yes [provider]  ?predniSONE (DELTASONE) 20 MG tablet Take 2 tablets (40 mg total) by mouth daily for 5 days. 07/08/21 07/13/21 Yes Eusebio FriendlyEaves, Kylan Veach B, PA-C  ?albuterol (ACCUNEB) 0.63 MG/3ML nebulizer solution Take 1 ampule by nebulization every 6 (six) hours as needed for wheezing.    [provider]  ?albuterol (VENTOLIN HFA) 108 (90 Base) MCG/ACT inhaler Inhale into the lungs. 01/19/16   [provider]  ?benzonatate (TESSALON) 100 MG capsule Take 1 capsule (100 mg total) by mouth every 8 (eight) hours. 07/06/21   Valinda HoarWhite, Adrienne R, NP  ?butalbital-aspirin-caffeine [facility](FIORINAL) 50-325-40 MG capsule Take by mouth. 05/13/16   [provider]  ?Cholecalciferol (VITAMIN D3)  2000 units capsule Take by mouth.    [provider]  ?losartan (COZAAR) 50 MG tablet Take by mouth. 10/28/16   [provider]  ?mometasone (NASONEX) 50 MCG/ACT nasal spray Place into the nose.    [provider]  ?promethazine-dextromethorphan (PROMETHAZINE-DM) 6.25-15 MG/5ML syrup Take 2.5 mLs by mouth 4 (four) times daily as needed for cough. 07/06/21   Valinda Hoar, NP  ?Spacer/Aero-Holding Chambers (AEROCHAMBER PLUS) inhaler Use with inhaler 01/17/21   Domenick Gong, MD  ?vitamin B-12 (CYANOCOBALAMIN) 1000 MCG tablet Take by  mouth.    [provider]  ? ? ?Family History ?Family History  ?Problem Relation Age of Onset  ? Cancer Father   ? Breast cancer Maternal Aunt   ? ? ?Social History ?Social History  ? ?Tobacco Use  ? Smoking status: Former  ?  Types: Cigarettes  ?  Quit date: 07/09/1993  ?  Years since quitting: 28.0  ? Smokeless tobacco: Never  ?Vaping Use  ? Vaping Use: Never used  ?Substance Use Topics  ? Alcohol use: Not Currently  ?  Comment: rarely  ? Drug use: No  ? ? ? ?Allergies   ?Venlafaxine, Sulfa antibiotics, Valacyclovir, and Lisinopril ? ? ?Review of Systems ?Review of Systems  ?Constitutional:  Positive for fatigue. Negative for chills, diaphoresis and fever.  ?HENT:  Positive for congestion and rhinorrhea. Negative for ear pain, sinus pressure, sinus pain and sore throat.   ?Respiratory:  Positive for cough and wheezing. Negative for shortness of breath.   ?Cardiovascular:  Negative for chest pain.  ?Gastrointestinal:  Negative for abdominal pain, nausea and vomiting.  ?Musculoskeletal:  Negative for arthralgias and myalgias.  ?Skin:  Negative for rash.  ?Neurological:  Negative for weakness and headaches.  ?Hematological:  Negative for adenopathy.  ? ? ?Physical Exam ?Triage Vital Signs ?ED Triage Vitals  ?Enc Vitals Group  ?   BP 07/08/21 1322 (!) 154/73  ?   Pulse Rate 07/08/21 1322 66  ?   Resp 07/08/21 1322 14  ?   Temp 07/08/21 1322 98 ?F (36.7 ?C)  ?   Temp Source 07/08/21 1322 Oral  ?   SpO2 07/08/21 1322 97 %  ?   Weight 07/08/21 1319 192 lb (87.1 kg)  ?   Height 07/08/21 1319 5\' 4"  (1.626 m)  ?   Head Circumference --   ?   Peak Flow --   ?   Pain Score 07/08/21 1319 0  ?   Pain Loc --   ?   Pain Edu? --   ?   Excl. in GC? --   ? ?No data found. ? ?Updated Vital Signs ?BP (!) 154/73 (BP Location: Right Arm)   Pulse 66   Temp 98 ?F (36.7 ?C) (Oral)   Resp 14   Ht 5\' 4"  (1.626 m)   Wt 192 lb (87.1 kg)   SpO2 97%   BMI 32.96 kg/m?  ? ?Physical Exam ?Vitals and nursing note reviewed.   ?Constitutional:   ?   General: She is not in acute distress. ?   Appearance: Normal appearance. She is not ill-appearing or toxic-appearing.  ?HENT:  ?   Head: Normocephalic and atraumatic.  ?   Nose: Congestion (mild) present.  ?   Mouth/Throat:  ?   Mouth: Mucous membranes are moist.  ?   Pharynx: Oropharynx is clear.  ?Eyes:  ?   General: No scleral icterus.    ?   Right eye: No discharge.     ?  Left eye: No discharge.  ?   Conjunctiva/sclera: Conjunctivae normal.  ?Cardiovascular:  ?   Rate and Rhythm: Normal rate and regular rhythm.  ?   Heart sounds: Normal heart sounds.  ?Pulmonary:  ?   Effort: Pulmonary effort is normal. No respiratory distress.  ?   Breath sounds: Wheezing (diffuse wheezing throughout) present.  ?Musculoskeletal:  ?   Cervical back: Neck supple.  ?Skin: ?   General: Skin is dry.  ?Neurological:  ?   General: No focal deficit present.  ?   Mental Status: She is alert. Mental status is at baseline.  ?   Motor: No weakness.  ?   Gait: Gait normal.  ?Psychiatric:     ?   Mood and Affect: Mood normal.     ?   Behavior: Behavior normal.     ?   Thought Content: Thought content normal.  ? ? ? ?UC Treatments / Results  ?Labs ?(all labs ordered are listed, but only abnormal results are displayed) ?Labs Reviewed - No data to display ? ?EKG ? ? ?Radiology ?DG Chest 2 View ? ?Result Date: 07/08/2021 ?CLINICAL DATA:  Cough and congestion. EXAM: CHEST - 2 VIEW COMPARISON:  06/22/2020 and prior studies FINDINGS: The cardiomediastinal silhouette is unremarkable. There is no evidence of focal airspace disease, pulmonary edema, suspicious pulmonary nodule/mass, pleural effusion, or pneumothorax. No acute bony abnormalities are identified. IMPRESSION: No active cardiopulmonary disease. Electronically Signed   By: Harmon Pier M.D.   On: 07/08/2021 13:54   ? ?Procedures ?Procedures (including critical care time) ? ?Medications Ordered in UC ?Medications - No data to display ? ?Initial Impression /  Assessment and Plan / UC Course  ?I have reviewed the triage vital signs and the nursing notes. ? ?Pertinent labs & imaging results that were available during my care of the patient were reviewed by me and considered in my medical de

## 2021-07-08 NOTE — ED Triage Notes (Signed)
Patient c/o cough and chest congestion that has gotten worse since she was last seen this past Friday at [facility].  Patient reports coughing up thick yellow sputum.  Patient denies fevers.  ?

## 2021-07-08 NOTE — Discharge Instructions (Signed)
-  Your chest x-ray does not show any evidence of pneumonia.  This is very reassuring. ?- I want you to continue taking Augmentin.  Continue using the nasal spray and increase your fluid intake and resting. ?- I have sent a different cough medication to the pharmacy.  It may make you little sleepy so do not take it and try to go to work.  Have given you a work note anyway for the next couple days but you can return sooner if you are feeling okay. ?- I also sent prednisone.  Use your inhalers and use your nebulizer when you get home. ?-Take 1 cough medicine or the other but not both. ?- Return or see PCP if you develop a fever, worsening cough or breathing difficulty. ?

## 2021-12-17 ENCOUNTER — Ambulatory Visit
Admission: EM | Admit: 2021-12-17 | Discharge: 2021-12-17 | Disposition: A | Discharge status: Home / Self Care | Payer: Medicare Other | Attending: Physician Assistant | Admitting: Physician Assistant

## 2021-12-17 DIAGNOSIS — J449 Chronic obstructive pulmonary disease, unspecified: Secondary | ICD-10-CM

## 2021-12-17 DIAGNOSIS — R051 Acute cough: Secondary | ICD-10-CM | POA: Insufficient documentation

## 2021-12-17 DIAGNOSIS — U071 COVID-19: Secondary | ICD-10-CM | POA: Diagnosis present

## 2021-12-17 LAB — COMPREHENSIVE METABOLIC PANEL
ALT: 23 U/L (ref 0–44)
AST: 45 U/L — ABNORMAL HIGH (ref 15–41)
Albumin: 3.9 g/dL (ref 3.5–5.0)
Alkaline Phosphatase: 66 U/L (ref 38–126)
Anion gap: 8 (ref 5–15)
BUN: 22 mg/dL (ref 8–23)
CO2: 24 mmol/L (ref 22–32)
Calcium: 9.1 mg/dL (ref 8.9–10.3)
Chloride: 105 mmol/L (ref 98–111)
Creatinine, Ser: 1.08 mg/dL — ABNORMAL HIGH (ref 0.44–1.00)
GFR, Estimated: 54 mL/min — ABNORMAL LOW (ref >60)
Glucose, Bld: 99 mg/dL (ref 70–99)
Potassium: 3.6 mmol/L (ref 3.5–5.1)
Sodium: 137 mmol/L (ref 135–145)
Total Bilirubin: 1 mg/dL (ref 0.3–1.2)
Total Protein: 7.8 g/dL (ref 6.5–8.1)

## 2021-12-17 LAB — RESP PANEL BY RT-PCR (FLU A&B, COVID) ARPGX2
Influenza A by PCR: NEGATIVE
Influenza B by PCR: NEGATIVE
SARS Coronavirus 2 by RT PCR: POSITIVE — AB

## 2021-12-17 MED ORDER — NIRMATRELVIR/RITONAVIR (PAXLOVID) TABLET (RENAL DOSING): ORAL_TABLET | ORAL | 0 refills | Status: DC

## 2021-12-17 NOTE — ED Provider Notes (Signed)
[facility]    CSN: [id number] Arrival date & time: 12/17/21  0849      History   Chief Complaint Chief Complaint  Patient presents with   Covid Positive    HPI Caitlin House is a 76 y.o. female with history of COPD, asthma, hypertension.  Patient presents today for 3-4-day history of cough, sore throat and congestion. Patient denies fever, fatigue, aches, chest pain, breathing difficulty, abdominal pain, nausea/vomiting or diarrhea.  Patient reports that she did take 2 COVID test at home.  One was positive and the other was inconclusive.  She has been taking cough medication which has been helpful for symptoms.  She reports she has not needed to use any of her inhalers.  She has had COVID vaccines but states she is not getting the newest COVID-vaccine.  She has had this year's flu vaccine about a week ago.  No other concerns today.  HPI  Past Medical History:  Diagnosis Date   Asthma flare    with bad colds   COPD (chronic obstructive pulmonary disease) (HCC)    Frequent headaches    migraines.  none in 2 yrs   Hepatitis    1960s. treated   Hypertension    Sinus problem    Wears dentures    partial upper and lower    There are no problems to display for this patient.   Past Surgical History:  Procedure Laterality Date   ABDOMINAL HYSTERECTOMY     BREAST BIOPSY Right 1996   neg   BREAST SURGERY Right    DEEP NECK LYMPH NODE BIOPSY / EXCISION  01/2006   GALLBLADDER SURGERY     HEMORRHOID SURGERY     KNEE ARTHROSCOPY WITH MEDIAL MENISECTOMY Right 09/29/2018   Procedure: KNEE ARTHROSCOPY WITH PARTIAL MEDIAL AND LATERAL MENISECTOMY SYNOVECTEMY;  Surgeon: Signa Kell, MD;  Location: [facility];  Service: Orthopedics;  Laterality: Right;   TRIGGER FINGER RELEASE Right     OB History   No obstetric history on file.      Home Medications    Prior to Admission medications   Medication Sig Start Date End Date Taking? Authorizing  Provider  albuterol (ACCUNEB) 0.63 MG/3ML nebulizer solution Take 1 ampule by nebulization every 6 (six) hours as needed for wheezing.   Yes [provider]  albuterol (VENTOLIN HFA) 108 (90 Base) MCG/ACT inhaler Inhale into the lungs. 01/19/16  Yes [provider]  atenolol-chlorthalidone (TENORETIC) 50-25 MG tablet Take 0.5 tablets by mouth.  05/14/16  Yes [provider]  butalbital-aspirin-caffeine Benny Lennert) 50-325-40 MG capsule Take by mouth. 05/13/16  Yes [provider]  Cholecalciferol (VITAMIN D3) 2000 units capsule Take by mouth.   Yes [provider]  losartan (COZAAR) 50 MG tablet Take by mouth. 10/28/16  Yes [provider]  pravastatin (PRAVACHOL) 20 MG tablet Take by mouth. 08/27/16  Yes [provider]  Spacer/Aero-Holding Chambers (AEROCHAMBER PLUS) inhaler Use with inhaler 01/17/21  Yes Domenick Gong, MD  vitamin B-12 (CYANOCOBALAMIN) 1000 MCG tablet Take by mouth.   Yes [provider]    Family History Family History  Problem Relation Age of Onset   Cancer Father    Breast cancer Maternal Aunt     Social History Social History   Tobacco Use   Smoking status: Former    Types: Cigarettes    Quit date: 07/09/1993    Years since quitting: 28.4   Smokeless tobacco: Never  Vaping Use   Vaping  Use: Never used  Substance Use Topics   Alcohol use: Not Currently    Comment: rarely   Drug use: No     Allergies   Venlafaxine, Sulfa antibiotics, Valacyclovir, and Lisinopril   Review of Systems Review of Systems  Constitutional:  Negative for chills, diaphoresis, fatigue and fever.  HENT:  Positive for congestion and sore throat. Negative for ear pain, rhinorrhea, sinus pressure and sinus pain.   Respiratory:  Positive for cough. Negative for shortness of breath and wheezing.   Gastrointestinal:  Negative for abdominal pain, nausea and vomiting.  Musculoskeletal:  Negative for arthralgias and  myalgias.  Skin:  Negative for rash.  Neurological:  Negative for weakness and headaches.  Hematological:  Negative for adenopathy.     Physical Exam Triage Vital Signs ED Triage Vitals  Enc Vitals Group     BP 12/17/21 0902 (!) 147/78     Pulse Rate 12/17/21 0902 65     Resp 12/17/21 0902 16     Temp 12/17/21 0902 97.9 F (36.6 C)     Temp Source 12/17/21 0902 Oral     SpO2 12/17/21 0902 96 %     Weight 12/17/21 0857 198 lb (89.8 kg)     Height 12/17/21 0857 5\' 4"  (1.626 m)     Head Circumference --      Peak Flow --      Pain Score 12/17/21 0853 0     Pain Loc --      Pain Edu? --      Excl. in GC? --    No data found.  Updated Vital Signs BP (!) 147/78 (BP Location: Left Arm)   Pulse 65   Temp 97.9 F (36.6 C) (Oral)   Resp 16   Ht 5\' 4"  (1.626 m)   Wt 198 lb (89.8 kg)   SpO2 96%   BMI 33.99 kg/m      Physical Exam Vitals and nursing note reviewed.  Constitutional:      General: She is not in acute distress.    Appearance: Normal appearance. She is not ill-appearing or toxic-appearing.  HENT:     Head: Normocephalic and atraumatic.     Nose: Congestion present.     Mouth/Throat:     Mouth: Mucous membranes are moist.     Pharynx: Oropharynx is clear. Posterior oropharyngeal erythema present.  Eyes:     General: No scleral icterus.       Right eye: No discharge.        Left eye: No discharge.     Conjunctiva/sclera: Conjunctivae normal.  Cardiovascular:     Rate and Rhythm: Normal rate and regular rhythm.     Heart sounds: Normal heart sounds.  Pulmonary:     Effort: Pulmonary effort is normal. No respiratory distress.     Breath sounds: Normal breath sounds. No wheezing, rhonchi or rales.  Musculoskeletal:     Cervical back: Neck supple.  Skin:    General: Skin is dry.  Neurological:     General: No focal deficit present.     Mental Status: She is alert. Mental status is at baseline.     Motor: No weakness.     Gait: Gait normal.   Psychiatric:        Mood and Affect: Mood normal.        Behavior: Behavior normal.        Thought Content: Thought content normal.      UC Treatments / Results  Labs (all labs ordered are listed, but only abnormal results are displayed) Labs Reviewed  RESP PANEL BY RT-PCR (FLU A&B, COVID) ARPGX2 - Abnormal; Notable for the following components:      Result Value   SARS Coronavirus 2 by RT PCR POSITIVE (*)    All other components within normal limits  COMPREHENSIVE METABOLIC PANEL    EKG   Radiology No results found.  Procedures Procedures (including critical care time)  Medications Ordered in UC Medications - No data to display  Initial Impression / Assessment and Plan / UC Course  I have reviewed the triage vital signs and the nursing notes.  Pertinent labs & imaging results that were available during my care of the patient were reviewed by me and considered in my medical decision making (see chart for details).   76 year old female presenting for sore throat, cough and congestion x 3-4 days.  She does have history of COPD/asthma.  She did take a COVID test at home and it was positive.  Vitals are all normal and stable.  She is overall well-appearing.  On exam she has mild nasal congestion and erythema posterior pharynx.  Chest is clear to auscultation heart regular rate and rhythm.  PCR COVID test obtained.  Expect positive still go ahead and obtain CMP so that I am able to prescribe Paxlovid if patient's kidney function is in normal range.  Her last lab work was done 7 months ago.  She does have reduced GFR 40 based on her blood work 7 months ago.  PCR COVID test obtained and positive.  CMP shows GFR is 54.  Will prescribe Paxlovid but at the reduced dose.  Advised patient she needs to hold her pravastatin for 10 days.  Reviewed current CDC guidelines, isolation protocol and ED precautions with patient.  Advised continued cough medication, rest and fluids.  Advised  to use inhalers as needed.  Advised to return or go to ER if uncontrolled fever, weakness or breathing difficulty not relieved by use of inhalers.   Final Clinical Impressions(s) / UC Diagnoses   Final diagnoses:  COVID-19  Acute cough  Chronic obstructive pulmonary disease, unspecified COPD type [facility])     Discharge Instructions      -Your COVID test is positive.  You need to stay home and isolate until Thursday.  Then may need to wear a mask for 5 more days. - I sent antiviral medication to pharmacy.  Do not take your pravastatin medication for 10 days starting today. - Continue over-the-counter cough medication and plenty rest and fluids. - Use your inhaler if needed for any shortness of breath.  If her cough starts become productive of discolored phlegm restarted feel short of breath, consider returning to clinic or going to ER for reevaluation as you could be having a COPD exacerbation.  Symptoms currently are not consistent with an exacerbation and hopefully antiviral medication prevents that from happening. - Hopefully you feel better in the next week or so.     ED Prescriptions   None    PDMP not reviewed this encounter.   Danton Clap, PA-C 12/17/21 1021

## 2021-12-17 NOTE — ED Triage Notes (Signed)
Pt c/o cough, sore throat, covid positive x4days   Pt tested positive at home and asks for a retest.

## 2021-12-17 NOTE — Discharge Instructions (Signed)
-  Your COVID test is positive.  You need to stay home and isolate until Thursday.  Then may need to wear a mask for 5 more days. - I sent antiviral medication to pharmacy.  Do not take your pravastatin medication for 10 days starting today. - Continue over-the-counter cough medication and plenty rest and fluids. - Use your inhaler if needed for any shortness of breath.  If her cough starts become productive of discolored phlegm restarted feel short of breath, consider returning to clinic or going to ER for reevaluation as you could be having a COPD exacerbation.  Symptoms currently are not consistent with an exacerbation and hopefully antiviral medication prevents that from happening. - Hopefully you feel better in the next week or so.

## 2022-01-14 ENCOUNTER — Other Ambulatory Visit: Payer: Self-pay | Admitting: Internal Medicine

## 2022-01-14 DIAGNOSIS — Z1231 Encounter for screening mammogram for malignant neoplasm of breast: Secondary | ICD-10-CM

## 2022-02-13 ENCOUNTER — Ambulatory Visit
Admission: RE | Admit: 2022-02-13 | Discharge: 2022-02-13 | Disposition: A | Discharge status: Home / Self Care | Payer: Medicare Other | Source: Ambulatory Visit | Attending: Internal Medicine | Admitting: Internal Medicine

## 2022-02-13 DIAGNOSIS — Z1231 Encounter for screening mammogram for malignant neoplasm of breast: Secondary | ICD-10-CM | POA: Diagnosis present

## 2023-01-22 ENCOUNTER — Other Ambulatory Visit: Payer: Self-pay | Admitting: Internal Medicine

## 2023-01-22 DIAGNOSIS — Z1231 Encounter for screening mammogram for malignant neoplasm of breast: Secondary | ICD-10-CM

## 2023-02-18 ENCOUNTER — Ambulatory Visit
Admission: RE | Admit: 2023-02-18 | Discharge: 2023-02-18 | Disposition: A | Discharge status: Home / Self Care | Payer: Medicare Other | Source: Ambulatory Visit | Attending: Internal Medicine | Admitting: Internal Medicine

## 2023-02-18 DIAGNOSIS — Z1231 Encounter for screening mammogram for malignant neoplasm of breast: Secondary | ICD-10-CM | POA: Insufficient documentation

## 2023-02-28 ENCOUNTER — Ambulatory Visit (INDEPENDENT_AMBULATORY_CARE_PROVIDER_SITE_OTHER): Payer: Medicare Other

## 2023-02-28 ENCOUNTER — Ambulatory Visit
Admission: EM | Admit: 2023-02-28 | Discharge: 2023-02-28 | Disposition: A | Discharge status: Home / Self Care | Payer: Medicare Other | Attending: Emergency Medicine | Admitting: Emergency Medicine

## 2023-02-28 ENCOUNTER — Encounter: Payer: Self-pay | Admitting: Emergency Medicine

## 2023-02-28 DIAGNOSIS — R051 Acute cough: Secondary | ICD-10-CM

## 2023-02-28 DIAGNOSIS — I10 Essential (primary) hypertension: Secondary | ICD-10-CM | POA: Diagnosis not present

## 2023-02-28 DIAGNOSIS — J441 Chronic obstructive pulmonary disease with (acute) exacerbation: Secondary | ICD-10-CM | POA: Diagnosis not present

## 2023-02-28 DIAGNOSIS — R911 Solitary pulmonary nodule: Secondary | ICD-10-CM

## 2023-02-28 MED ORDER — PROMETHAZINE-DM 6.25-15 MG/5ML PO SYRP: 5.0000 mL | ORAL_SOLUTION | Freq: Four times a day (QID) | ORAL | 0 refills | Status: AC | PRN

## 2023-02-28 MED ORDER — ALBUTEROL SULFATE HFA 108 (90 BASE) MCG/ACT IN AERS: 2.0000 | INHALATION_SPRAY | RESPIRATORY_TRACT | 0 refills | Status: AC | PRN

## 2023-02-28 MED ORDER — AEROCHAMBER MV MISC: 1 refills | Status: AC

## 2023-02-28 MED ORDER — PREDNISONE 20 MG PO TABS: 40.0000 mg | ORAL_TABLET | Freq: Every day | ORAL | 0 refills | Status: AC

## 2023-02-28 MED ORDER — AMOXICILLIN-POT CLAVULANATE 875-125 MG PO TABS: 1.0000 | ORAL_TABLET | Freq: Two times a day (BID) | ORAL | 0 refills | Status: DC

## 2023-02-28 NOTE — Discharge Instructions (Signed)
Take two puffs from your albuterol inhaler every 4 hours for 2 days, then every 6 hours for 2 days, then as needed. You can back off if you start to fimprove  sooner. Finish the steroids unless your doctor tells you to stop. Finish the antibiotics, even if you feel better.  Finish the azithromycin.  Promethazine DM as needed for cough.  Make sure you drink extra fluids. Return if you get worse, have a fever >100.4, or any other concerns.   If the spacer is too expensive at the pharmacy, you can get an AeroChamber Z-Stat off of [employer] for about $10-$15.  Go to www.goodrx.com  or www.costplusdrugs.com to look up your medications. This will give you a list of where you can find your prescriptions at the most affordable prices. Or ask the pharmacist what the cash price is, or if they have any other discount programs available to help make your medication more affordable. This can be less expensive than what you would pay with insurance.

## 2023-02-28 NOTE — ED Provider Notes (Signed)
HPI  SUBJECTIVE:  Caitlin House is a 77 y.o. female who presents with 5 days of a cough productive of clear phlegm, increasing amount of sputum production, clear nasal congestion, wheezing, shortness of breath.  No rhinorrhea, fevers, chest pain, dyspnea on exertion, sinus pain or pressure, facial swelling, upper dental pain.  She is unable to sleep at night because of the cough.  She reports feeling better and then getting worse.  No known COVID or flu exposure.  She had COVID and flu vaccines.  She is on day #4/5 of the Z-Pak and has been taking over-the-counter cough medicine.  She has been taking 10 mg of prednisone daily for the past 3 days.  Symptoms are worse with exertion and with lying down, worse with sitting straight up.  She has a past medical history of asthma/COPD with no admissions.  She is not using her albuterol inhaler or nebulizer machine.  She also has a past medical history of hypertension - states that her blood pressure is always high at the doctor's office.  No history of diabetes, chronic kidney disease.  PCP[facility]   Past Medical History:  Diagnosis Date   Asthma flare    with bad colds   COPD (chronic obstructive pulmonary disease) (HCC)    Frequent headaches    migraines.  none in 2 yrs   Hepatitis    1960s. treated   Hypertension    Sinus problem    Wears dentures    partial upper and lower    Past Surgical History:  Procedure Laterality Date   ABDOMINAL HYSTERECTOMY     BREAST BIOPSY Right 1996   neg   BREAST EXCISIONAL BIOPSY Right    DEEP NECK LYMPH NODE BIOPSY / EXCISION  01/2006   GALLBLADDER SURGERY     HEMORRHOID SURGERY     KNEE ARTHROSCOPY WITH MEDIAL MENISECTOMY Right 09/29/2018   Procedure: KNEE ARTHROSCOPY WITH PARTIAL MEDIAL AND LATERAL MENISECTOMY SYNOVECTEMY;  Surgeon: Signa Kell, MD;  Location: [facility];  Service: Orthopedics;  Laterality: Right;   TRIGGER FINGER RELEASE Right     Family History   Problem Relation Age of Onset   Cancer Father    Breast cancer Maternal Aunt     Social History   Tobacco Use   Smoking status: Former    Current packs/day: 0.00    Types: Cigarettes    Quit date: 07/09/1993    Years since quitting: 29.6   Smokeless tobacco: Never  Vaping Use   Vaping status: Never Used  Substance Use Topics   Alcohol use: Not Currently    Comment: rarely   Drug use: No    No current facility-administered medications for this encounter.  Current Outpatient Medications:    amoxicillin-clavulanate (AUGMENTIN) 875-125 MG tablet, Take 1 tablet by mouth every 12 (twelve) hours., Disp: 14 tablet, Rfl: 0   azithromycin (ZITHROMAX) 250 MG tablet, Take 2 tablets (500mg ) by mouth on Day 1. Take 1 tablet (250mg ) by mouth on Days 2-5., Disp: , Rfl:    predniSONE (DELTASONE) 20 MG tablet, Take 2 tablets (40 mg total) by mouth daily with breakfast for 5 days., Disp: 10 tablet, Rfl: 0   promethazine-dextromethorphan (PROMETHAZINE-DM) 6.25-15 MG/5ML syrup, Take 5 mLs by mouth 4 (four) times daily as needed for cough., Disp: 118 mL, Rfl: 0   Spacer/Aero-Holding Chambers (AEROCHAMBER MV) inhaler, Use as instructed, Disp: 1 each, Rfl: 1   albuterol (ACCUNEB) 0.63 MG/3ML nebulizer solution, Take 1 ampule by nebulization  every 6 (six) hours as needed for wheezing., Disp: , Rfl:    albuterol (VENTOLIN HFA) 108 (90 Base) MCG/ACT inhaler, Inhale 2 puffs into the lungs every 4 (four) hours as needed for wheezing or shortness of breath., Disp: 18 g, Rfl: 0   atenolol-chlorthalidone (TENORETIC) 50-25 MG tablet, Take 0.5 tablets by mouth. , Disp: , Rfl:    butalbital-aspirin-caffeine (FIORINAL) 50-325-40 MG capsule, Take by mouth., Disp: , Rfl:    Cholecalciferol (VITAMIN D3) 2000 units capsule, Take by mouth., Disp: , Rfl:    losartan (COZAAR) 50 MG tablet, Take by mouth., Disp: , Rfl:    pravastatin (PRAVACHOL) 20 MG tablet, Take by mouth., Disp: , Rfl:    Spacer/Aero-Holding Chambers  (AEROCHAMBER PLUS) inhaler, Use with inhaler, Disp: 1 each, Rfl: 2   vitamin B-12 (CYANOCOBALAMIN) 1000 MCG tablet, Take by mouth., Disp: , Rfl:   Allergies  Allergen Reactions   Venlafaxine Nausea Only   Sulfa Antibiotics     Liver issues   Valacyclovir Nausea And Vomiting   Lisinopril Cough     ROS  As noted in HPI.   Physical Exam  BP (!) 187/81 (BP Location: Right Arm)   Pulse 62   Temp 97.9 F (36.6 C) (Oral)   Resp 16   Ht 5\' 4"  (1.626 m)   Wt 89.8 kg   SpO2 94%   BMI 33.98 kg/m   BP Readings from Last 3 Encounters:  02/28/23 (!) 187/81  12/17/21 (!) 147/78  07/08/21 (!) 154/73     Constitutional: Well developed, well nourished, no acute distress Eyes:  EOMI, conjunctiva normal bilaterally HENT: Normocephalic, atraumatic,mucus membranes moist.  Nasal congestion.  No maxillary, frontal sinus tenderness Respiratory: Normal inspiratory effort, fair air movement, diffuse wheezing throughout all lung fields.  No rales or rhonchi. Cardiovascular: Normal rate, regular rhythm, no murmurs rubs or gallops. GI: nondistended skin: No rash, skin intact Musculoskeletal: no deformities Neurologic: Alert & oriented x 3, no focal neuro deficits Psychiatric: Speech and behavior appropriate   ED Course   Medications - No data to display  Orders Placed This Encounter  Procedures   DG Chest 2 View    Standing Status:   Standing    Number of Occurrences:   1    Reason for Exam (SYMPTOM  OR DIAGNOSIS REQUIRED):   Worsening cough, congestion, history of COPD, rule out acute cardiopulmonary process   Ambulatory referral to Pulmonology    Referral Priority:   Routine    Referral Type:   Consultation    Referral Reason:   Specialty Services Required    Requested Specialty:   Pulmonary Disease    Number of Visits Requested:   1    No results found for this or any previous visit (from the past 24 hours). DG Chest 2 View Result Date: 02/28/2023 CLINICAL DATA:  Worsening  cough and congestion. EXAM: CHEST - 2 VIEW COMPARISON:  07/08/2021 FINDINGS: Left lung clear. Small nodular density right mid lung is new in the interval. No substantial pleural effusion. No pulmonary edema. The cardiopericardial silhouette is within normal limits for size. No acute bony abnormality. IMPRESSION: Small nodular density right mid lung is new in the interval. CT chest without contrast recommended to further evaluate. Electronically Signed   By: Kennith Center M.D.   On: 02/28/2023 12:51    ED Clinical Impression  1. COPD exacerbation (HCC)   2. Acute cough   3. Elevated blood pressure reading with diagnosis of hypertension   4.  Solitary pulmonary nodule      ED Assessment/Plan     Outside records reviewed.  As noted in HPI.  1.  COPD exacerbation. Reviewed imaging independently.  No obvious consolidation as read by me.  Radiology report pending.  Will contact patient [phone] if radiology overread differs enough from mine and we need to change management.   Reviewed radiology report.  No pneumonia consistent with my read.  New small pulmonary nodule right midlung.  See radiology report for full details.  Will have staff contact patient regarding pulmonary nodule, and put in urgent pulmonology referral.  Will treat as a COPD exacerbation with 7 days of Augmentin, regularly scheduled albuterol inhaler with a spacer for 4 days, then as needed thereafter, prednisone 40 mg for 5 days, Promethazine DM.  Follow-up with PCP if not better in a week.  ER return precautions given.  2.  Elevated blood pressure reading with diagnosis of hypertension.  Patient states that it is always elevated when she goes to the doctor.  She is currently asymptomatic.  She took her medications this morning.  She states her blood pressures in the 130s over 80s at home.  She will monitor this at home.,  And will follow-up with her PCP.  Discussed imaging, MDM, treatment plan, and plan for follow-up with  patient. Discussed sn/sx that should prompt return to the ED. patient agrees with plan.   Meds ordered this encounter  Medications   albuterol (VENTOLIN HFA) 108 (90 Base) MCG/ACT inhaler    Sig: Inhale 2 puffs into the lungs every 4 (four) hours as needed for wheezing or shortness of breath.    Dispense:  18 g    Refill:  0   amoxicillin-clavulanate (AUGMENTIN) 875-125 MG tablet    Sig: Take 1 tablet by mouth every 12 (twelve) hours.    Dispense:  14 tablet    Refill:  0   Spacer/Aero-Holding Chambers (AEROCHAMBER MV) inhaler    Sig: Use as instructed    Dispense:  1 each    Refill:  1   predniSONE (DELTASONE) 20 MG tablet    Sig: Take 2 tablets (40 mg total) by mouth daily with breakfast for 5 days.    Dispense:  10 tablet    Refill:  0   promethazine-dextromethorphan (PROMETHAZINE-DM) 6.25-15 MG/5ML syrup    Sig: Take 5 mLs by mouth 4 (four) times daily as needed for cough.    Dispense:  118 mL    Refill:  0      *This clinic note was created using Scientist, clinical (histocompatibility and immunogenetics). Therefore, there may be occasional mistakes despite careful proofreading.  ?    Domenick Gong, MD 03/02/23 1739

## 2023-02-28 NOTE — ED Triage Notes (Signed)
Patient c/o cough and chest congestion for a week.  Patient has 1 pill left of her Azithromycin.  Patient denies fevers.

## 2023-03-14 ENCOUNTER — Other Ambulatory Visit: Payer: Self-pay | Admitting: Internal Medicine

## 2023-03-14 DIAGNOSIS — R911 Solitary pulmonary nodule: Secondary | ICD-10-CM

## 2023-03-14 DIAGNOSIS — J441 Chronic obstructive pulmonary disease with (acute) exacerbation: Secondary | ICD-10-CM

## 2023-03-24 ENCOUNTER — Ambulatory Visit
Admission: RE | Admit: 2023-03-24 | Discharge: 2023-03-24 | Disposition: A | Discharge status: Home / Self Care | Payer: Medicare Other | Source: Ambulatory Visit | Attending: Internal Medicine | Admitting: Internal Medicine

## 2023-03-24 DIAGNOSIS — J441 Chronic obstructive pulmonary disease with (acute) exacerbation: Secondary | ICD-10-CM | POA: Insufficient documentation

## 2023-03-24 DIAGNOSIS — R911 Solitary pulmonary nodule: Secondary | ICD-10-CM | POA: Diagnosis present

## 2023-08-29 ENCOUNTER — Ambulatory Visit

## 2023-08-29 DIAGNOSIS — K640 First degree hemorrhoids: Secondary | ICD-10-CM | POA: Diagnosis not present

## 2023-08-29 DIAGNOSIS — K5730 Diverticulosis of large intestine without perforation or abscess without bleeding: Secondary | ICD-10-CM | POA: Diagnosis not present

## 2023-08-29 DIAGNOSIS — Z1211 Encounter for screening for malignant neoplasm of colon: Secondary | ICD-10-CM | POA: Diagnosis present

## 2023-09-04 ENCOUNTER — Ambulatory Visit
Admission: EM | Admit: 2023-09-04 | Discharge: 2023-09-04 | Attending: Physician Assistant | Admitting: Physician Assistant

## 2023-09-04 ENCOUNTER — Ambulatory Visit (INDEPENDENT_AMBULATORY_CARE_PROVIDER_SITE_OTHER)

## 2023-09-04 DIAGNOSIS — R1012 Left upper quadrant pain: Secondary | ICD-10-CM | POA: Insufficient documentation

## 2023-09-04 DIAGNOSIS — R1032 Left lower quadrant pain: Secondary | ICD-10-CM | POA: Insufficient documentation

## 2023-09-04 DIAGNOSIS — I10 Essential (primary) hypertension: Secondary | ICD-10-CM | POA: Diagnosis present

## 2023-09-04 DIAGNOSIS — R109 Unspecified abdominal pain: Secondary | ICD-10-CM | POA: Insufficient documentation

## 2023-09-04 LAB — URINALYSIS, W/ REFLEX TO CULTURE (INFECTION SUSPECTED)
Bilirubin Urine: NEGATIVE
Glucose, UA: NEGATIVE mg/dL
Hgb urine dipstick: NEGATIVE
Ketones, ur: NEGATIVE mg/dL
Leukocytes,Ua: NEGATIVE
Nitrite: NEGATIVE
Protein, ur: NEGATIVE mg/dL
RBC / HPF: NONE SEEN RBC/hpf (ref 0–5)
Specific Gravity, Urine: 1.02 (ref 1.005–1.030)
WBC, UA: NONE SEEN WBC/hpf (ref 0–5)
pH: 5.5 (ref 5.0–8.0)

## 2023-09-04 NOTE — ED Triage Notes (Signed)
 Pt c/o left side flank pain x1day  Pt denies urinary urgency or frequency  Pt states that the pain is sharp and stays in the left flank  Pt had a colostomy last Friday and had no issue passing stools .

## 2023-09-04 NOTE — Discharge Instructions (Addendum)
-   Your urine test was normal.  There is no sign of a urinary tract infection and no blood in the urine. - Your x-ray does not show any constipation, bowel obstructions or kidney stones. - We did discuss there are a lot of different causes for left-sided abdominal pain including diverticulitis/diverticulosis, other bowel infections, bowel ischemia, acid reflux, gastritis, stomach ulcers.  Also cannot rule out blood clot in your lung. - Discussed that you likely will need a CT scan to be able to accurately diagnose the cause of your pain and we do not have access to that this evening in the urgent care.  I have advised going to the ER tonight since you have severe pain.  As discussed if you have diverticulitis or other infection that is not treated promptly it could worsen and lead to worsening infection.  In some cases this could be life-threatening. - If you do not go to the ER tonight I would suggest taking Tylenol  and/or ibuprofen  if you are able to take that at home, resting and monitoring closely.  If at any point the pain worsens, you have fever, pain in your chest, shortness of breath, weakness, or any other acute worsening of her symptoms you should call 911 or have someone take you immediately to the ER.  If you do not go to the ER tonight call your doctor first thing in the morning.

## 2023-09-04 NOTE — ED Provider Notes (Signed)
 [facility]    CSN: [id number] Arrival date & time: 09/04/23  1636      History   Chief Complaint Chief Complaint  Patient presents with   Abdominal Pain   Flank Pain    HPI Caitlin House is a 78 y.o. female with history of COPD, hypertension, asthma and frequent headaches.  Surgical history significant for abdominal hysterectomy, gallbladder surgery.  Patient presents today for onset of left lower abdominal pain, left upper quadrant pain with radiation to the left flank.  Pain is sharp, constant and worse with movement.  Pain is worse with deep breathing but she does not feel short of breath.  Denies fever, fatigue, cough, congestion, chest pain, dizziness, palpitations.  Has not noticed any rashes or swelling.  No falls or injuries.  Denies dysuria, frequency, urgency, hematuria.  No diarrhea, constipation, gas excess.  No abdominal bloating.  States she moved a heavy piece of furniture the day before onset of pain and is not sure if that is related. Has not taken anything for pain relief.  History of PE or DVT.  Patient had colonoscopy performed last week.  History of blood in her stool.  She was found to have diverticula which she was already aware of.  She has never had diverticulitis.  HPI  Past Medical History:  Diagnosis Date   Asthma flare    with bad colds   COPD (chronic obstructive pulmonary disease) (HCC)    Frequent headaches    migraines.  none in 2 yrs   Hepatitis    1960s. treated   Hypertension    Sinus problem    Wears dentures    partial upper and lower    There are no active problems to display for this patient.   Past Surgical History:  Procedure Laterality Date   ABDOMINAL HYSTERECTOMY     BREAST BIOPSY Right 1996   neg   BREAST EXCISIONAL BIOPSY Right    DEEP NECK LYMPH NODE BIOPSY / EXCISION  01/2006   GALLBLADDER SURGERY     HEMORRHOID SURGERY     KNEE ARTHROSCOPY WITH MEDIAL MENISECTOMY Right 09/29/2018    Procedure: KNEE ARTHROSCOPY WITH PARTIAL MEDIAL AND LATERAL MENISECTOMY SYNOVECTEMY;  Surgeon: Tobie Priest, MD;  Location: [facility];  Service: Orthopedics;  Laterality: Right;   TRIGGER FINGER RELEASE Right     OB History   No obstetric history on file.      Home Medications    Prior to Admission medications   Medication Sig Start Date End Date Taking? Authorizing Provider  albuterol  (ACCUNEB ) 0.63 MG/3ML nebulizer solution Take 1 ampule by nebulization every 6 (six) hours as needed for wheezing.   Yes [provider]  albuterol  (VENTOLIN  HFA) 108 (90 Base) MCG/ACT inhaler Inhale 2 puffs into the lungs every 4 (four) hours as needed for wheezing or shortness of breath. 02/28/23  Yes Van Knee, MD  atenolol-chlorthalidone (TENORETIC) 50-25 MG tablet Take 0.5 tablets by mouth.  05/14/16  Yes [provider]  butalbital-aspirin -caffeine (FIORINAL) 50-325-40 MG capsule Take by mouth. 05/13/16  Yes [provider]  Cholecalciferol (VITAMIN D3) 2000 units capsule Take by mouth.   Yes [provider]  losartan (COZAAR) 50 MG tablet Take by mouth. 10/28/16  Yes [provider]  pravastatin (PRAVACHOL) 20 MG tablet Take by mouth. 08/27/16  Yes [provider]  Spacer/Aero-Holding Chambers (AEROCHAMBER MV) inhaler Use as instructed 02/28/23  Yes Van Knee, MD  Spacer/Aero-Holding Chambers (AEROCHAMBER PLUS) inhaler Use  with inhaler 01/17/21  Yes Mortenson, Ashley, MD  vitamin B-12 (CYANOCOBALAMIN) 1000 MCG tablet Take by mouth.   Yes [provider]  amoxicillin -clavulanate (AUGMENTIN ) 875-125 MG tablet Take 1 tablet by mouth every 12 (twelve) hours. 02/28/23   Van Knee, MD  promethazine -dextromethorphan (PROMETHAZINE -DM) 6.25-15 MG/5ML syrup Take 5 mLs by mouth 4 (four) times daily as needed for cough. 02/28/23   Van Knee, MD    Family History Family History  Problem Relation Age of Onset    Cancer Father    Breast cancer Maternal Aunt     Social History Social History   Tobacco Use   Smoking status: Former    Current packs/day: 0.00    Types: Cigarettes    Quit date: 07/09/1993    Years since quitting: 30.1   Smokeless tobacco: Never  Vaping Use   Vaping status: Never Used  Substance Use Topics   Alcohol use: Not Currently    Comment: rarely   Drug use: No     Allergies   Venlafaxine, Sulfa antibiotics, Valacyclovir, and Lisinopril   Review of Systems Review of Systems  Constitutional:  Negative for appetite change, fatigue and fever.  HENT:  Negative for congestion.   Respiratory:  Negative for cough, shortness of breath and wheezing.   Cardiovascular:  Negative for chest pain, palpitations and leg swelling.  Gastrointestinal:  Positive for abdominal pain. Negative for abdominal distention, anal bleeding, blood in stool (none recently), constipation, diarrhea, nausea and vomiting.  Genitourinary:  Positive for flank pain. Negative for difficulty urinating, frequency, hematuria, pelvic pain and urgency.  Musculoskeletal:  Positive for back pain.  Neurological:  Negative for dizziness, weakness and headaches.     Physical Exam Triage Vital Signs ED Triage Vitals  Encounter Vitals Group     BP 09/04/23 1643 (!) 194/79     Girls Systolic BP Percentile --      Girls Diastolic BP Percentile --      Boys Systolic BP Percentile --      Boys Diastolic BP Percentile --      Pulse Rate 09/04/23 1643 71     Resp --      Temp 09/04/23 1643 99 F (37.2 C)     Temp Source 09/04/23 1643 Oral     SpO2 09/04/23 1643 95 %     Weight 09/04/23 1646 182 lb (82.6 kg)     Height 09/04/23 1646 5' 2 (1.575 m)     Head Circumference --      Peak Flow --      Pain Score 09/04/23 1644 10     Pain Loc --      Pain Education --      Exclude from Growth Chart --    No data found.  Updated Vital Signs BP (!) 186/74 (BP Location: Left Arm)   Pulse 69   Temp 99 F  (37.2 C) (Oral)   Ht 5' 2 (1.575 m)   Wt 182 lb (82.6 kg)   SpO2 95%   BMI 33.29 kg/m     Physical Exam Vitals and nursing note reviewed.  Constitutional:      General: She is not in acute distress.    Appearance: Normal appearance. She is well-developed. She is not ill-appearing or toxic-appearing.  HENT:     Head: Normocephalic and atraumatic.     Nose: Nose normal.     Mouth/Throat:     Mouth: Mucous membranes are moist.     Pharynx: Oropharynx is  clear.   Eyes:     General: No scleral icterus.       Right eye: No discharge.        Left eye: No discharge.     Conjunctiva/sclera: Conjunctivae normal.    Cardiovascular:     Rate and Rhythm: Normal rate and regular rhythm.     Heart sounds: Normal heart sounds.  Pulmonary:     Effort: Pulmonary effort is normal. No respiratory distress.     Breath sounds: Normal breath sounds.  Abdominal:     Palpations: Abdomen is soft.     Tenderness: There is abdominal tenderness in the suprapubic area, left upper quadrant and left lower quadrant. There is guarding (LUQ, LLQ). There is no right CVA tenderness or left CVA tenderness.   Musculoskeletal:     Cervical back: Neck supple.     Right lower leg: No edema.     Left lower leg: No edema.   Skin:    General: Skin is dry.   Neurological:     General: No focal deficit present.     Mental Status: She is alert. Mental status is at baseline.     Motor: No weakness.     Gait: Gait normal.   Psychiatric:        Mood and Affect: Mood normal.        Behavior: Behavior normal.      UC Treatments / Results  Labs (all labs ordered are listed, but only abnormal results are displayed) Labs Reviewed  URINALYSIS, W/ REFLEX TO CULTURE (INFECTION SUSPECTED) - Abnormal; Notable for the following components:      Result Value   Bacteria, UA RARE (*)    All other components within normal limits    EKG   Radiology DG Abdomen 1 View Result Date: 09/04/2023 CLINICAL DATA:   left flank pain EXAM: ABDOMEN - 1 VIEW COMPARISON:  June 03, 2022 report only FINDINGS: Nonobstructive bowel gas pattern. No pneumoperitoneum. Cholecystectomy clips. No organomegaly or radiopaque calculi. No acute fracture or destructive lesion. Elevation of the right hemidiaphragm. The lung bases are clear.Multilevel degenerative disc disease of the spine. IMPRESSION: Nonobstructive bowel gas pattern. Electronically Signed   By: Rogelia Myers M.D.   On: 09/04/2023 17:16    Procedures Procedures (including critical care time)  Medications Ordered in UC Medications - No data to display  Initial Impression / Assessment and Plan / UC Course  I have reviewed the triage vital signs and the nursing notes.  Pertinent labs & imaging results that were available during my care of the patient were reviewed by me and considered in my medical decision making (see chart for details).   78 year old female presents for severe sharp left upper quadrant abdominal pain, left lower quadrant abdominal pain, left flank pain over the past several hours.  No fever, cough, congestion, chest pain, shortness of breath, nausea/vomiting, diarrhea, constipation, dysuria, frequency, urgency, hematuria.  Blood pressure 194/79.  Patient is not ill-appearing.  Nontoxic.  On exam has chest clear to auscultation heart regular rate and rhythm.  Abdomen soft with tenderness of the left upper quadrant, left lower quadrant with guarding in both of these regions.  Mild suprapubic tenderness.  No CVA tenderness.  No lower extremity swelling.  Urinalysis negative.  Not consistent with UTI.  KUB obtained is normal.  Consistent with bowel obstruction, kidney stone.  Blood pressure recheck is 186/74.  Patient advised to continue taking Cozaar and atenolol-chlorthalidone for hypertension.  Keep log of BP.  If consistently greater than 140/90 should follow-up with prescriber.  Reviewed all results with patient.  Advised patient I  encouraged her to go to emergency department for CT scan to assess for further cause of her significant left-sided abdominal pain.  Explained my concern for something like diverticulitis or other GI infection or GI ischemia.  Patient says she would like to go home and speak with her husband first before considering going to emergency department.  She declines EMS transport.  We reviewed the risks of delaying emergent care.  Explained she could have underlying condition which could be life-threatening.  Patient is understanding.  She states that if she does not go to emergency department tonight she will contact her PCP first thing tomorrow.    Final Clinical Impressions(s) / UC Diagnoses   Final diagnoses:  Left flank pain  Abdominal pain, left upper quadrant  Abdominal pain, left lower quadrant  Essential hypertension     Discharge Instructions      - Your urine test was normal.  There is no sign of a urinary tract infection and no blood in the urine. - Your x-ray does not show any constipation, bowel obstructions or kidney stones. - We did discuss there are a lot of different causes for left-sided abdominal pain including diverticulitis/diverticulosis, other bowel infections, bowel ischemia, acid reflux, gastritis, stomach ulcers.  Also cannot rule out blood clot in your lung. - Discussed that you likely will need a CT scan to be able to accurately diagnose the cause of your pain and we do not have access to that this evening in the urgent care.  I have advised going to the ER tonight since you have severe pain.  As discussed if you have diverticulitis or other infection that is not treated promptly it could worsen and lead to worsening infection.  In some cases this could be life-threatening. - If you do not go to the ER tonight I would suggest taking Tylenol  and/or ibuprofen  if you are able to take that at home, resting and monitoring closely.  If at any point the pain worsens, you have  fever, pain in your chest, shortness of breath, weakness, or any other acute worsening of her symptoms you should call 911 or have someone take you immediately to the ER.  If you do not go to the ER tonight call your doctor first thing in the morning.     ED Prescriptions   None    PDMP not reviewed this encounter.   Arvis Jolan NOVAK, PA-C 09/08/23 [phone]

## 2023-09-04 NOTE — ED Notes (Signed)
 Patient is being discharged from the Urgent Care and sent to the Emergency Department via personal vehicle . Per Lyle Host, patient is in need of higher level of care due to abdominal pain. Patient is aware and verbalizes understanding of plan of care.  Vitals:   09/04/23 1643 09/04/23 1727  BP: (!) 194/79 (!) 186/74  Pulse: 71 69  Temp: 99 F (37.2 C)   SpO2: 95%

## 2023-09-05 ENCOUNTER — Other Ambulatory Visit: Payer: Self-pay

## 2023-09-05 ENCOUNTER — Emergency Department

## 2023-09-05 ENCOUNTER — Encounter: Payer: Self-pay | Admitting: Emergency Medicine

## 2023-09-05 ENCOUNTER — Emergency Department
Admission: EM | Admit: 2023-09-05 | Discharge: 2023-09-05 | Disposition: A | Discharge status: Home / Self Care | Attending: Emergency Medicine | Admitting: Emergency Medicine

## 2023-09-05 DIAGNOSIS — K5792 Diverticulitis of intestine, part unspecified, without perforation or abscess without bleeding: Secondary | ICD-10-CM | POA: Insufficient documentation

## 2023-09-05 DIAGNOSIS — N83202 Unspecified ovarian cyst, left side: Secondary | ICD-10-CM | POA: Insufficient documentation

## 2023-09-05 DIAGNOSIS — R109 Unspecified abdominal pain: Secondary | ICD-10-CM | POA: Diagnosis present

## 2023-09-05 LAB — CBC
HCT: 37.8 % (ref 36.0–46.0)
Hemoglobin: 12.9 g/dL (ref 12.0–15.0)
MCH: 32 pg (ref 26.0–34.0)
MCHC: 34.1 g/dL (ref 30.0–36.0)
MCV: 93.8 fL (ref 80.0–100.0)
Platelets: 134 10*3/uL — ABNORMAL LOW (ref 150–400)
RBC: 4.03 MIL/uL (ref 3.87–5.11)
RDW: 11.9 % (ref 11.5–15.5)
WBC: 8.9 10*3/uL (ref 4.0–10.5)
nRBC: 0 % (ref 0.0–0.2)

## 2023-09-05 LAB — COMPREHENSIVE METABOLIC PANEL WITH GFR
ALT: 17 U/L (ref 0–44)
AST: 32 U/L (ref 15–41)
Albumin: 3.7 g/dL (ref 3.5–5.0)
Alkaline Phosphatase: 62 U/L (ref 38–126)
Anion gap: 7 (ref 5–15)
BUN: 26 mg/dL — ABNORMAL HIGH (ref 8–23)
CO2: 26 mmol/L (ref 22–32)
Calcium: 9.1 mg/dL (ref 8.9–10.3)
Chloride: 104 mmol/L (ref 98–111)
Creatinine, Ser: 1.08 mg/dL — ABNORMAL HIGH (ref 0.44–1.00)
GFR, Estimated: 53 mL/min — ABNORMAL LOW (ref >60)
Glucose, Bld: 101 mg/dL — ABNORMAL HIGH (ref 70–99)
Potassium: 3.6 mmol/L (ref 3.5–5.1)
Sodium: 137 mmol/L (ref 135–145)
Total Bilirubin: 2 mg/dL — ABNORMAL HIGH (ref 0.0–1.2)
Total Protein: 6.8 g/dL (ref 6.5–8.1)

## 2023-09-05 LAB — LIPASE, BLOOD: Lipase: 35 U/L (ref 11–51)

## 2023-09-05 MED ORDER — ONDANSETRON HCL 4 MG PO TABS: 4.0000 mg | ORAL_TABLET | Freq: Four times a day (QID) | ORAL | 0 refills | Status: AC | PRN

## 2023-09-05 MED ORDER — METRONIDAZOLE 500 MG PO TABS: 500.0000 mg | ORAL_TABLET | Freq: Three times a day (TID) | ORAL | 0 refills | Status: AC

## 2023-09-05 MED ORDER — CEPHALEXIN 500 MG PO CAPS: 500.0000 mg | ORAL_CAPSULE | Freq: Four times a day (QID) | ORAL | 0 refills | Status: AC

## 2023-09-05 MED ORDER — IOHEXOL 300 MG/ML  SOLN
100.0000 mL | Freq: Once | INTRAMUSCULAR | Status: AC | PRN
  Administered 2023-09-05: 100 mL via INTRAVENOUS

## 2023-09-05 MED ORDER — HYDROCODONE-ACETAMINOPHEN 5-325 MG PO TABS: 1.0000 | ORAL_TABLET | Freq: Four times a day (QID) | ORAL | 0 refills | Status: AC | PRN

## 2023-09-05 NOTE — ED Provider Notes (Signed)
 [facility] Provider Note    Event Date/Time   First MD Initiated Contact with Patient 09/05/23 1128     (approximate)   History   Abdominal Pain   HPI  Caitlin House is a 78 year old female presenting to the emergency department for evaluation of abdominal pain starting yesterday.  No associated nausea, vomiting, diarrhea.  Does have a history of multiple prior abdominal surgeries.  No recent trauma.  Denies dysuria, urinary frequency.  She was seen at urgent care yesterday, I did review this visit in her chart.  There, she had a urinalysis without evidence of UTI.  KUB with nonobstructive bowel gas pattern.  It is recommended that she present to the ER for further evaluation.  Today, patient actually reports some improvement in her pain, but presents given prior recommendation.    Physical Exam   Triage Vital Signs: ED Triage Vitals  Encounter Vitals Group     BP 09/05/23 0959 (!) 199/70     Girls Systolic BP Percentile --      Girls Diastolic BP Percentile --      Boys Systolic BP Percentile --      Boys Diastolic BP Percentile --      Pulse Rate 09/05/23 0959 69     Resp 09/05/23 0959 15     Temp 09/05/23 0959 98.2 F (36.8 C)     Temp Source 09/05/23 0959 Oral     SpO2 09/05/23 0959 100 %     Weight 09/05/23 1129 181 lb 14.1 oz (82.5 kg)     Height 09/05/23 1129 5' 2 (1.575 m)     Head Circumference --      Peak Flow --      Pain Score 09/05/23 1231 6     Pain Loc --      Pain Education --      Exclude from Growth Chart --     Most recent vital signs: Vitals:   09/05/23 1231 09/05/23 1404  BP: (!) 188/69 (!) 178/70  Pulse: 70 70  Resp: 16 16  Temp:  98 F (36.7 C)  SpO2: 100% 100%     General: Awake, interactive  CV:  Regular rate, good peripheral perfusion.  Resp:  Unlabored respirations, lungs clear to auscultation Abd:  Nondistended, tenderness to palpation of the left upper and lower abdomen without rebound or  guarding, remainder of abdomen nontender Neuro:  Symmetric facial movement, fluid speech   ED Results / Procedures / Treatments   Labs (all labs ordered are listed, but only abnormal results are displayed) Labs Reviewed  COMPREHENSIVE METABOLIC PANEL WITH GFR - Abnormal; Notable for the following components:      Result Value   Glucose, Bld 101 (*)    BUN 26 (*)    Creatinine, Ser 1.08 (*)    Total Bilirubin 2.0 (*)    GFR, Estimated 53 (*)    All other components within normal limits  CBC - Abnormal; Notable for the following components:   Platelets 134 (*)    All other components within normal limits  LIPASE, BLOOD  URINALYSIS, ROUTINE W REFLEX MICROSCOPIC     EKG EKG independently reviewed and interpreted by myself demonstrates:    RADIOLOGY Imaging independently reviewed and interpreted by myself demonstrates:  CT abdomen pelvis demonstrates diverticulitis without associated abscess.  Left adnexal cyst noted.  Radiology recommends follow-up ultrasound in 6 to 12 months.  Patient updated on this finding.  Formal Radiology Read:  CT ABDOMEN PELVIS W CONTRAST Result Date: 09/05/2023 CLINICAL DATA:  Acute left lower quadrant abdominal pain. EXAM: CT ABDOMEN AND PELVIS WITH CONTRAST TECHNIQUE: Multidetector CT imaging of the abdomen and pelvis was performed using the standard protocol following bolus administration of intravenous contrast. RADIATION DOSE REDUCTION: This exam was performed according to the departmental dose-optimization program which includes automated exposure control, adjustment of the mA and/or kV according to patient size and/or use of iterative reconstruction technique. CONTRAST:  100mL OMNIPAQUE IOHEXOL 300 MG/ML  SOLN COMPARISON:  None Available. FINDINGS: Lower chest: No acute abnormality. Hepatobiliary: Nodular hepatic contours suggesting hepatic cirrhosis. Status post cholecystectomy. No biliary dilatation. Pancreas: Unremarkable. No pancreatic ductal  dilatation or surrounding inflammatory changes. Spleen: Normal in size without focal abnormality. Adrenals/Urinary Tract: Adrenal glands are unremarkable. Kidneys are normal, without renal calculi, focal lesion, or hydronephrosis. Bladder is unremarkable. Stomach/Bowel: The stomach and appendix are unremarkable. There is no evidence of bowel obstruction. Moderate focal diverticulitis of descending colon is noted without abscess formation. Vascular/Lymphatic: Aortic atherosclerosis. No enlarged abdominal or pelvic lymph nodes. Reproductive: Status post hysterectomy. 5.4 cm left ovarian cyst is noted. Other: No ascites or hernia. Musculoskeletal: No acute or significant osseous findings. IMPRESSION: Moderate focal diverticulitis of descending colon without abscess formation. Nodular hepatic contours suggesting hepatic cirrhosis. 5.4 cm left adnexal cyst. Recommend follow-up US  in 6-12 months. Note: This recommendation does not apply to premenarchal patients and to those with increased risk (genetic, family history, elevated tumor markers or other high-risk factors) of ovarian cancer. Reference: JACR 2020 Feb; 17(2):248-254. Aortic Atherosclerosis (ICD10-I70.0). Electronically Signed   By: Lynwood Landy Raddle M.D.   On: 09/05/2023 13:52   DG Abdomen 1 View Result Date: 09/04/2023 CLINICAL DATA:  left flank pain EXAM: ABDOMEN - 1 VIEW COMPARISON:  June 03, 2022 report only FINDINGS: Nonobstructive bowel gas pattern. No pneumoperitoneum. Cholecystectomy clips. No organomegaly or radiopaque calculi. No acute fracture or destructive lesion. Elevation of the right hemidiaphragm. The lung bases are clear.Multilevel degenerative disc disease of the spine. IMPRESSION: Nonobstructive bowel gas pattern. Electronically Signed   By: Rogelia Myers M.D.   On: 09/04/2023 17:16    PROCEDURES:  Critical Care performed: No  Procedures   MEDICATIONS ORDERED IN ED: Medications  iohexol (OMNIPAQUE) 300 MG/ML solution 100 mL  (100 mLs Intravenous Contrast Given 09/05/23 1238)     IMPRESSION / MDM / ASSESSMENT AND PLAN / ED COURSE  I reviewed the triage vital signs and the nursing notes.  Differential diagnosis includes, but is not limited to, diverticulitis, colitis, musculoskeletal strain, splenic pathology  Patient's presentation is most consistent with acute presentation with potential threat to life or bodily function.  78 year old female presenting to the emergency department for evaluation of abdominal pain.  Currently declining pain or nausea medication.  Labs in triage overall reassuring.  Normal white blood cell count.  Creatinine at baseline.  Normal lipase.  Will obtain CT to further evaluate.  1408 CT demonstrates diverticulitis.  Also notes ovarian cyst.  If patient updated on workup.  Instructed have follow-up ultrasound as an outpatient.  Pain remains adequately controlled.  She is comfortable with discharge with antibiotics, nausea and pain medication.  Strict return precautions provided.  Patient discharged stable condition.      FINAL CLINICAL IMPRESSION(S) / ED DIAGNOSES   Final diagnoses:  Diverticulitis  Cyst of left ovary     Rx / DC Orders   ED Discharge Orders          Ordered  cephALEXin (KEFLEX) 500 MG capsule  4 times daily        09/05/23 1411    metroNIDAZOLE (FLAGYL) 500 MG tablet  3 times daily        09/05/23 1411    HYDROcodone -acetaminophen  (NORCO/VICODIN) 5-325 MG tablet  Every 6 hours PRN        09/05/23 1411    ondansetron  (ZOFRAN ) 4 MG tablet  Every 6 hours PRN        09/05/23 1411             Note:  This document was prepared using Dragon voice recognition software and may include unintentional dictation errors.   Levander Slate, MD 09/05/23 [phone]

## 2023-09-05 NOTE — ED Notes (Signed)
 See triage note.  Presents with pain to left side of abd  States pain started yesterday   Pain is worse at present

## 2023-09-05 NOTE — ED Triage Notes (Signed)
 Pt reports LLQ abdominal pain that started yesterday. Pt was seen at [facility] and was told to come to the ER for CT scan. Pt denies any urinary symptoms.

## 2023-09-05 NOTE — Discharge Instructions (Signed)
 You were seen in the emergency department today for evaluation of your abdominal pain.  Your CT did show diverticulitis which I suspect is causing your pain.  As we discussed, it did also show a left ovarian cyst which I think is less likely causing your pain.  Radiologist does recommend a follow-up ultrasound in 6 to 12 months to evaluate this.  I have sent a prescription for 2 antibiotics to your pharmacy.  Please take these as directed.  I have also sent prescriptions for as needed pain medication.  This can make you drowsy, do not drive or operate machinery when taking this.  I also have sent prescription for nausea medication.  Follow with your primary care doctor for further evaluation.  Return to the ER for new or worsening symptoms.

## 2023-10-22 ENCOUNTER — Inpatient Hospital Stay

## 2023-10-22 ENCOUNTER — Encounter: Payer: Self-pay | Admitting: Obstetrics and Gynecology

## 2023-10-22 ENCOUNTER — Inpatient Hospital Stay: Attending: Obstetrics and Gynecology | Admitting: Obstetrics and Gynecology

## 2023-10-22 VITALS — BP 176/69 | HR 58 | Temp 98.6°F | Resp 18 | Wt 180.5 lb

## 2023-10-22 DIAGNOSIS — J4489 Other specified chronic obstructive pulmonary disease: Secondary | ICD-10-CM | POA: Insufficient documentation

## 2023-10-22 DIAGNOSIS — Z79899 Other long term (current) drug therapy: Secondary | ICD-10-CM | POA: Insufficient documentation

## 2023-10-22 DIAGNOSIS — Z9049 Acquired absence of other specified parts of digestive tract: Secondary | ICD-10-CM | POA: Diagnosis not present

## 2023-10-22 DIAGNOSIS — N83202 Unspecified ovarian cyst, left side: Secondary | ICD-10-CM | POA: Insufficient documentation

## 2023-10-22 DIAGNOSIS — M109 Gout, unspecified: Secondary | ICD-10-CM | POA: Insufficient documentation

## 2023-10-22 DIAGNOSIS — Z809 Family history of malignant neoplasm, unspecified: Secondary | ICD-10-CM | POA: Insufficient documentation

## 2023-10-22 DIAGNOSIS — Z803 Family history of malignant neoplasm of breast: Secondary | ICD-10-CM | POA: Diagnosis not present

## 2023-10-22 DIAGNOSIS — Z87891 Personal history of nicotine dependence: Secondary | ICD-10-CM | POA: Insufficient documentation

## 2023-10-22 DIAGNOSIS — Z860100 Personal history of colon polyps, unspecified: Secondary | ICD-10-CM | POA: Diagnosis not present

## 2023-10-22 DIAGNOSIS — N83209 Unspecified ovarian cyst, unspecified side: Secondary | ICD-10-CM | POA: Insufficient documentation

## 2023-10-22 DIAGNOSIS — E538 Deficiency of other specified B group vitamins: Secondary | ICD-10-CM | POA: Insufficient documentation

## 2023-10-22 DIAGNOSIS — J4520 Mild intermittent asthma, uncomplicated: Secondary | ICD-10-CM | POA: Insufficient documentation

## 2023-10-22 DIAGNOSIS — E785 Hyperlipidemia, unspecified: Secondary | ICD-10-CM | POA: Diagnosis not present

## 2023-10-22 DIAGNOSIS — R978 Other abnormal tumor markers: Secondary | ICD-10-CM | POA: Insufficient documentation

## 2023-10-22 DIAGNOSIS — I10 Essential (primary) hypertension: Secondary | ICD-10-CM | POA: Insufficient documentation

## 2023-10-22 DIAGNOSIS — Z90711 Acquired absence of uterus with remaining cervical stump: Secondary | ICD-10-CM | POA: Insufficient documentation

## 2023-10-22 NOTE — Progress Notes (Signed)
 Gynecologic Oncology Consult Visit   Referring Provider: Dr Lovetta  Chief Concern: left ovarian cyst and elevated tumor markers Subjective:  Caitlin House is a 78 y.o. female who is seen in consultation from Dr. Lovetta for incidentally found left ovarian cyst and elevated tumor markers.   Saw Dr Lovetta 10/07/23. On September 05, 2023, she underwent a colonoscopy. Six days later, she experienced severe pain in the left lower quadrant. She visited a walk-in clinic where an x-ray was performed, but she lacked the necessary equipment for a comprehensive evaluation, leading to a referral to the emergency department. At the emergency department, she was diagnosed with diverticulitis and started on antibiotics. During this visit, an incidental finding of a cyst on her left ovary was noted. The pain has since resolved completely. She has a history of a hysterectomy performed over 35 years ago due to heavy menstrual bleeding, but her ovaries were preserved. She has been pregnant three times and has two children, both delivered vaginally. No family history of gynecologic cancer.  10/07/23 CA125 43.7, HE4 119.  Pelvic US  10/07/23 Indication: Endovaginal imaging was necessary to evaluate the ovaries.    Uterus ====== Surgically Absent  Right Ovary ========= Visualized, Normal. Outline: smooth. Morphology: appropriate. Size 15 mm x 15 mm x 10 mm  No cysts identified  Right Tube ========= Not visualized  Left Ovary ======== Visualized, enlarged. Outline: smooth. Morphology: appropriate. Size 57 mm x 51 mm x 59 mm Cyst(s) Complex unilocular. Size 44.0 mm x 48.0 mm x 45 mm. Mean 45.7 mm. Vol 49.763 cm  Left Tube ======== Not visualized  Cul de Sac ========= Normal  Bladder ====== Visualized, Empty  Impression ========= Endovaginal sonogram was performed due to the indications outlined above.  The uterus is surgically absent. Vaginal cuff appears grossly  normal.  The ovaries were both visualized. Right ovary appears unremarkable. Left ovary contains a complex unilocular cyst. Cyst appears anechoic and contains a hyperechoic solid mass measuring 21 x 10 x 21mm, suspected internal papillary projection. Vascularity seen within papillary projection.   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  No unintentional weight loss or current pain. She is actively trying to lose weight.  Past Medical History: has a past medical history of Abnormal Pap smear of vagina, Asthma, mild intermittent, well-controlled (HHS-HCC) (01/03/2014), B12 deficiency (06/19/2015), Colon polyp, Essential hypertension, benign (01/03/2014), Gout (11/16/2013), Hyperlipemia (11/13/2013), and Migraines (11/13/2013).   Past Surgical History: Abdominal hysterectomy (09/1984); Cholecystectomy (1997); Deep neck lymph node biopsy / excision (Right, 01/2006); Breast excisional biopsy (1991); Incision Tendon Sheath For Trigger Finger (Right); Hemorrhoidectomy External; Trunk skin lesion excisional biopsy (Right); right knee arthroscopy, partial medial and lateral menisecectomy, chondroplasty of patellofemoral, medial and lateral compartments (Right, 09/29/2018); and Knee arthroscopy.  Social History: reports that she quit smoking about 30 years ago. Her smoking use included cigarettes. She started smoking about 55 years ago. She has been exposed to tobacco smoke. She has never used smokeless tobacco. She reports that she does not drink alcohol and does not use drugs.   Problem List: There are no active problems to display for this patient.   Past Medical History: Past Medical History:  Diagnosis Date   Asthma flare    with bad colds   COPD (chronic obstructive pulmonary disease) (HCC)    Frequent headaches    migraines.  none in 2 yrs   Hepatitis    1960s. treated   Hypertension    Sinus problem  Wears dentures    partial upper and lower    Past Surgical History: Past  Surgical History:  Procedure Laterality Date   ABDOMINAL HYSTERECTOMY     BREAST BIOPSY Right 1996   neg   BREAST EXCISIONAL BIOPSY Right    DEEP NECK LYMPH NODE BIOPSY / EXCISION  01/2006   GALLBLADDER SURGERY     HEMORRHOID SURGERY     KNEE ARTHROSCOPY WITH MEDIAL MENISECTOMY Right 09/29/2018   Procedure: KNEE ARTHROSCOPY WITH PARTIAL MEDIAL AND LATERAL MENISECTOMY SYNOVECTEMY;  Surgeon: Tobie Priest, MD;  Location: [facility];  Service: Orthopedics;  Laterality: Right;   TRIGGER FINGER RELEASE Right         Family History: Family History  Problem Relation Age of Onset   Cancer Father    Breast cancer Maternal Aunt     Social History: Social History   Socioeconomic History   Marital status: Married    Spouse name: Not on file   Number of children: Not on file   Years of education: Not on file   Highest education level: Not on file  Occupational History   Not on file  Tobacco Use   Smoking status: Former    Current packs/day: 0.00    Types: Cigarettes    Quit date: 07/09/1993    Years since quitting: 30.3   Smokeless tobacco: Never  Vaping Use   Vaping status: Never Used  Substance and Sexual Activity   Alcohol use: Not Currently    Comment: rarely   Drug use: No   Sexual activity: Not Currently  Other Topics Concern   Not on file  Social History Narrative   Not on file   Social Drivers of Health   Financial Resource Strain: Low Risk  (10/07/2023)   Received from [facility]   Overall Financial Resource Strain (CARDIA)    Difficulty of Paying Living Expenses: Not hard at all  Food Insecurity: No Food Insecurity (10/07/2023)   Received from [facility]   Hunger Vital Sign    Within the past 12 months, you worried that your food would run out before you got the money to buy more.: Never true    Within the past 12 months, the food you bought just didn't last and you didn't have money to get more.: Never true   Transportation Needs: No Transportation Needs (10/07/2023)   Received from [facility]    In the past 12 months, has lack of transportation kept you from medical appointments or from getting medications?: No    Lack of Transportation (Non-Medical): No  Physical Activity: Not on file  Stress: Not on file  Social Connections: Not on file  Intimate Partner Violence: Not on file    Allergies: Allergies  Allergen Reactions   Venlafaxine Nausea Only   Sulfa Antibiotics     Liver issues   Valacyclovir Nausea And Vomiting   Lisinopril Cough    Current Medications: Current Outpatient Medications  Medication Sig Dispense Refill   albuterol  (ACCUNEB ) 0.63 MG/3ML nebulizer solution Take 1 ampule by nebulization every 6 (six) hours as needed for wheezing.     albuterol  (VENTOLIN  HFA) 108 (90 Base) MCG/ACT inhaler Inhale 2 puffs into the lungs every 4 (four) hours as needed for wheezing or shortness of breath. 18 g 0   amoxicillin -clavulanate (AUGMENTIN ) 875-125 MG tablet Take 1 tablet by mouth every 12 (twelve) hours. 14 tablet 0   atenolol-chlorthalidone (TENORETIC) 50-25  MG tablet Take 0.5 tablets by mouth.      butalbital-aspirin -caffeine (FIORINAL) 50-325-40 MG capsule Take by mouth.     Cholecalciferol (VITAMIN D3) 2000 units capsule Take by mouth.     losartan (COZAAR) 50 MG tablet Take by mouth.     pravastatin (PRAVACHOL) 20 MG tablet Take by mouth.     promethazine -dextromethorphan (PROMETHAZINE -DM) 6.25-15 MG/5ML syrup Take 5 mLs by mouth 4 (four) times daily as needed for cough. 118 mL 0   Spacer/Aero-Holding Chambers (AEROCHAMBER MV) inhaler Use as instructed 1 each 1   Spacer/Aero-Holding Chambers (AEROCHAMBER PLUS) inhaler Use with inhaler 1 each 2   vitamin B-12 (CYANOCOBALAMIN) 1000 MCG tablet Take by mouth.     No current facility-administered medications for this visit.    Review of Systems General: negative for, fevers,  chills, fatigue, changes in sleep, changes in weight or appetite Skin: negative for changes in color, texture, moles or lesions Eyes: negative for, changes in vision, pain, diplopia HEENT: negative for, change in hearing, pain, discharge, tinnitus, vertigo, voice changes, sore throat, neck masses Breasts: negative for breast lumps Pulmonary: negative for, dyspnea, orthopnea, productive cough Cardiac: negative for, palpitations, syncope, pain, discomfort, pressure Gastrointestinal: negative for, dysphagia, nausea, vomiting, jaundice, pain, constipation, diarrhea, hematemesis, hematochezia Genitourinary/Sexual: negative for, dysuria, discharge, hesitancy, nocturia, retention, stones, infections, STD's, incontinence Ob/Gyn: negative for, irregular bleeding, pain Musculoskeletal: negative for, pain, stiffness, swelling, range of motion limitation Hematology: negative for, easy bruising, bleeding Neurologic/Psych: negative for, headaches, seizures, paralysis, weakness, tremor, change in gait, change in sensation, mood swings, depression, anxiety, change in memory  Objective:  Physical Examination:  There were no vitals taken for this visit.   ECOG Performance Status: 0 - Asymptomatic  General appearance: alert, cooperative, and appears stated age HEENT:PERRLA and neck supple with midline trachea Lymph node survey: non-palpable, axillary, inguinal, supraclavicular Cardiovascular: regular rate and rhythm, no murmurs or gallops Respiratory: normal air entry, lungs clear to auscultation and no rales, rhonchi or wheezing Abdomen: soft, non-tender, without masses or organomegaly, no hernias, and well healed incision Back: inspection of back is normal Extremities: extremities normal, atraumatic, no cyanosis or edema Skin exam - normal coloration and turgor, no rashes, no suspicious skin lesions noted. Neurological exam reveals alert, oriented, normal speech, no focal findings or movement disorder  noted.  Pelvic: exam chaperoned by nurse;  Vulva: normal appearing vulva with no masses, tenderness or lesions; Vagina: normal vagina; Adnexa: normal adnexa in size, nontender and no masses; Rectal: not indicated  Radiologic Imaging: CT scan 09/05/23 FINDINGS: Lower chest: No acute abnormality.   Hepatobiliary: Nodular hepatic contours suggesting hepatic cirrhosis. Status post cholecystectomy. No biliary dilatation.   Pancreas: Unremarkable. No pancreatic ductal dilatation or surrounding inflammatory changes.   Spleen: Normal in size without focal abnormality.   Adrenals/Urinary Tract: Adrenal glands are unremarkable. Kidneys are normal, without renal calculi, focal lesion, or hydronephrosis. Bladder is unremarkable.   Stomach/Bowel: The stomach and appendix are unremarkable. There is no evidence of bowel obstruction. Moderate focal diverticulitis of descending colon is noted without abscess formation.   Vascular/Lymphatic: Aortic atherosclerosis. No enlarged abdominal or pelvic lymph nodes.   Reproductive: Status post hysterectomy. 5.4 cm left ovarian cyst is noted.   Other: No ascites or hernia.   Musculoskeletal: No acute or significant osseous findings.   IMPRESSION: Moderate focal diverticulitis of descending colon without abscess formation.   Nodular hepatic contours suggesting hepatic cirrhosis.   5.4 cm left adnexal cyst. Recommend follow-up US  in 6-12 months. Note: This  recommendation does not apply to premenarchal patients and to those with increased risk (genetic, family history, elevated tumor markers or other high-risk factors) of ovarian cancer. Reference: JACR 2020 Feb; 17(2):248-254.  Lab Review    Chemistry      Component Value Date/Time   NA 137 09/05/2023 1001   K 3.6 09/05/2023 1001   CL 104 09/05/2023 1001   CO2 26 09/05/2023 1001   BUN 26 (H) 09/05/2023 1001   CREATININE 1.08 (H) 09/05/2023 1001      Component Value Date/Time   CALCIUM  9.1 09/05/2023 1001   ALKPHOS 62 09/05/2023 1001   AST 32 09/05/2023 1001   ALT 17 09/05/2023 1001   BILITOT 2.0 (H) 09/05/2023 1001      Lab Results  Component Value Date   WBC 8.9 09/05/2023   HGB 12.9 09/05/2023   HCT 37.8 09/05/2023   MCV 93.8 09/05/2023   PLT 134 (L) 09/05/2023    Assessment:  Caitlin House is a 78 y.o. female diagnosed with 5.9 cm unilocular left ovarian cyst on CT scan 09/05/23 for work up of left pelvic pain a week post colonoscopy.  Diagnosed with diverticulitis and symptoms resolved with antibiotics. Seen by Dr Lovetta 10/07/23 CA125 = 43.7, HE4 = 119 TVUS  in office showed left ovary contains a complex unilocular cyst. Cyst appears anechoic and contains a hyperechoic solid mass measuring 21 x 10 x 21mm, suspected internal papillary projection. Vascularity seen within papillary projection.  No prior abdominal imaging for comparison.   Pain that she experienced could be due to episode of left ovarian torsion rather than diverticulitis.  Tumor markers mildly elevated, but this also could be due to diverticulitis and/or renal dysfunction.  The ovarian mass looks unilocular on CT scan, but multilocular with possible papillary projection on office TVUS.    Medical co-morbidities complicating care: hypertension, benign (01/03/2014), Gout (11/16/2013), Hyperlipemia (11/13/2013), and Migraines (11/13/2013)..  Plan:   Problem List Items Addressed This Visit       Endocrine   Ovarian cyst - Primary   We discussed options for management including radiology US  for further characterization of the left ovarian mass including ORADs score and determination of whether the solid area represents a papillary projection versus residual normal ovary.  .    Suggested return to clinic after Radiology US  done with ORADS score. Will repeat CA125 and HE4 at that time.    The patient's diagnosis, an outline of the further diagnostic and laboratory studies which will be  required, the recommendation, and alternatives were discussed.  All questions were answered to the patient's satisfaction.  A total of 30 minutes were spent with the patient/family today; 50% was spent in education, counseling and coordination of care for left ovarian mass.    Prentice Agent, MD    CC:  Schermerhorn, Debby PARAS, MD [address] [facility] [place],  [place] [address] [phone]

## 2023-10-27 ENCOUNTER — Ambulatory Visit
Admission: RE | Admit: 2023-10-27 | Discharge: 2023-10-27 | Disposition: A | Discharge status: Home / Self Care | Source: Ambulatory Visit | Attending: Obstetrics and Gynecology | Admitting: Obstetrics and Gynecology

## 2023-10-27 DIAGNOSIS — N83202 Unspecified ovarian cyst, left side: Secondary | ICD-10-CM | POA: Diagnosis present

## 2023-11-12 ENCOUNTER — Telehealth: Payer: Self-pay

## 2023-11-12 ENCOUNTER — Telehealth: Payer: Self-pay | Admitting: Nurse Practitioner

## 2023-11-12 DIAGNOSIS — N83202 Unspecified ovarian cyst, left side: Secondary | ICD-10-CM

## 2023-11-12 NOTE — Telephone Encounter (Signed)
 Spoke with Ms. Corella and provided results of pelvic US . She will keep her upcoming lab/physician follow up.

## 2023-11-12 NOTE — Telephone Encounter (Signed)
 Called patient to review results of ultrasound which was reassuring. Dr Mancil recommends following up as scheduled with labs prior. No answer. Left VM.

## 2023-12-03 ENCOUNTER — Inpatient Hospital Stay: Attending: Obstetrics and Gynecology

## 2023-12-03 DIAGNOSIS — N83202 Unspecified ovarian cyst, left side: Secondary | ICD-10-CM | POA: Diagnosis present

## 2023-12-03 DIAGNOSIS — R971 Elevated cancer antigen 125 [CA 125]: Secondary | ICD-10-CM | POA: Insufficient documentation

## 2023-12-04 LAB — HUMAN EPIDIDYMIS PROT 4,SERIAL: HE4: 119 pmol/L — ABNORMAL HIGH (ref 0.0–96.9)

## 2023-12-04 LAB — CA 125: Cancer Antigen (CA) 125: 43 U/mL — ABNORMAL HIGH (ref 0.0–38.1)

## 2023-12-10 ENCOUNTER — Inpatient Hospital Stay: Attending: Obstetrics and Gynecology | Admitting: Obstetrics and Gynecology

## 2023-12-10 ENCOUNTER — Inpatient Hospital Stay

## 2023-12-10 VITALS — BP 185/71 | HR 63 | Temp 97.8°F | Resp 20 | Wt 187.5 lb

## 2023-12-10 DIAGNOSIS — E785 Hyperlipidemia, unspecified: Secondary | ICD-10-CM | POA: Diagnosis not present

## 2023-12-10 DIAGNOSIS — K5732 Diverticulitis of large intestine without perforation or abscess without bleeding: Secondary | ICD-10-CM | POA: Diagnosis not present

## 2023-12-10 DIAGNOSIS — Z79899 Other long term (current) drug therapy: Secondary | ICD-10-CM | POA: Insufficient documentation

## 2023-12-10 DIAGNOSIS — Z888 Allergy status to other drugs, medicaments and biological substances status: Secondary | ICD-10-CM | POA: Diagnosis not present

## 2023-12-10 DIAGNOSIS — R17 Unspecified jaundice: Secondary | ICD-10-CM | POA: Insufficient documentation

## 2023-12-10 DIAGNOSIS — R978 Other abnormal tumor markers: Secondary | ICD-10-CM | POA: Diagnosis not present

## 2023-12-10 DIAGNOSIS — N83202 Unspecified ovarian cyst, left side: Secondary | ICD-10-CM | POA: Diagnosis not present

## 2023-12-10 DIAGNOSIS — Z860100 Personal history of colon polyps, unspecified: Secondary | ICD-10-CM | POA: Insufficient documentation

## 2023-12-10 DIAGNOSIS — Z90710 Acquired absence of both cervix and uterus: Secondary | ICD-10-CM | POA: Diagnosis not present

## 2023-12-10 DIAGNOSIS — Z9049 Acquired absence of other specified parts of digestive tract: Secondary | ICD-10-CM | POA: Diagnosis not present

## 2023-12-10 DIAGNOSIS — Z8719 Personal history of other diseases of the digestive system: Secondary | ICD-10-CM | POA: Diagnosis not present

## 2023-12-10 DIAGNOSIS — M109 Gout, unspecified: Secondary | ICD-10-CM | POA: Insufficient documentation

## 2023-12-10 DIAGNOSIS — Z87891 Personal history of nicotine dependence: Secondary | ICD-10-CM | POA: Insufficient documentation

## 2023-12-10 DIAGNOSIS — Z7722 Contact with and (suspected) exposure to environmental tobacco smoke (acute) (chronic): Secondary | ICD-10-CM | POA: Diagnosis not present

## 2023-12-10 DIAGNOSIS — Z7189 Other specified counseling: Secondary | ICD-10-CM

## 2023-12-10 DIAGNOSIS — N83292 Other ovarian cyst, left side: Secondary | ICD-10-CM | POA: Diagnosis not present

## 2023-12-10 DIAGNOSIS — I10 Essential (primary) hypertension: Secondary | ICD-10-CM | POA: Diagnosis not present

## 2023-12-10 DIAGNOSIS — J4520 Mild intermittent asthma, uncomplicated: Secondary | ICD-10-CM | POA: Diagnosis not present

## 2023-12-10 DIAGNOSIS — Z803 Family history of malignant neoplasm of breast: Secondary | ICD-10-CM | POA: Diagnosis not present

## 2023-12-10 DIAGNOSIS — Z882 Allergy status to sulfonamides status: Secondary | ICD-10-CM | POA: Insufficient documentation

## 2023-12-10 DIAGNOSIS — Z90722 Acquired absence of ovaries, bilateral: Secondary | ICD-10-CM | POA: Diagnosis not present

## 2023-12-10 DIAGNOSIS — Z809 Family history of malignant neoplasm, unspecified: Secondary | ICD-10-CM | POA: Insufficient documentation

## 2023-12-10 LAB — CMP (CANCER CENTER ONLY)
ALT: 18 U/L (ref 0–44)
AST: 30 U/L (ref 15–41)
Albumin: 4.2 g/dL (ref 3.5–5.0)
Alkaline Phosphatase: 66 U/L (ref 38–126)
Anion gap: 7 (ref 5–15)
BUN: 44 mg/dL — ABNORMAL HIGH (ref 8–23)
CO2: 21 mmol/L — ABNORMAL LOW (ref 22–32)
Calcium: 9.3 mg/dL (ref 8.9–10.3)
Chloride: 106 mmol/L (ref 98–111)
Creatinine: 1.26 mg/dL — ABNORMAL HIGH (ref 0.44–1.00)
GFR, Estimated: 44 mL/min — ABNORMAL LOW (ref >60)
Glucose, Bld: 191 mg/dL — ABNORMAL HIGH (ref 70–99)
Potassium: 4 mmol/L (ref 3.5–5.1)
Sodium: 134 mmol/L — ABNORMAL LOW (ref 135–145)
Total Bilirubin: 1.1 mg/dL (ref 0.0–1.2)
Total Protein: 7.3 g/dL (ref 6.5–8.1)

## 2023-12-10 NOTE — Progress Notes (Signed)
 Gynecologic Oncology Interval Visit   Referring Provider: Dr Lovetta  Chief Concern: left ovarian cyst and elevated tumor markers Subjective:  Caitlin House is a 78 y.o. female who is seen in consultation from Dr. Lovetta for incidentally found left ovarian cyst and elevated tumor markers.    She returns to clinic after Radiology US  done with ORADS score. Will repeat CA125 and HE4 at that time. She has no concerning complaints.    10/27/2023  FINDINGS: Right ovary: Not visualized. No adnexal mass. A a ovoid loculated fluid collection is seen within right adnexa which may represent a mesenteric or paraovarian cyst. This measures 6.1 cm in greatest dimension.   Left ovary: Measurements: 5.2 x 4.5 x 4.4 cm = volume: 53 mL. The left ovary is entirely replaced by a simple cyst. No solid adnexal mass identified.   IMPRESSION: 1. 6.1 cm loculated fluid collection within the right adnexa which may represent a mesenteric or paraovarian cyst.  2. 5.3 cm simple cyst within the left ovary. This is best characterized as a O-Rads category 2 cyst. Recommend follow-up US  in 12 months.   12/03/2023 CA125 43.0 and HE4 119.0 ROMA 40.84%   Gynecologic Oncology History Caitlin House is a pleasant female who is seen in consultation from Dr. Cleotilde for incidentally found left ovarian cyst and elevated tumor markers.   Saw Dr Lovetta 10/07/23. On September 05, 2023, she underwent a colonoscopy. Six days later, she experienced severe pain in the left lower quadrant. She visited a walk-in clinic where an x-ray was performed, but she lacked the necessary equipment for a comprehensive evaluation, leading to a referral to the emergency department. At the emergency department, she was diagnosed with diverticulitis and started on antibiotics. During this visit, an incidental finding of a cyst on her left ovary was noted. The pain has since resolved completely. She has a history of a  hysterectomy performed over 35 years ago due to heavy menstrual bleeding, but her ovaries were preserved. She has been pregnant three times and has two children, both delivered vaginally. No family history of gynecologic cancer.  10/07/23 CA125 43.7, HE4 119.  Pelvic US  10/07/23 Indication: Endovaginal imaging was necessary to evaluate the ovaries.    Uterus ====== Surgically Absent  Right Ovary ========= Visualized, Normal. Outline: smooth. Morphology: appropriate. Size 15 mm x 15 mm x 10 mm  No cysts identified  Right Tube ========= Not visualized  Left Ovary ======== Visualized, enlarged. Outline: smooth. Morphology: appropriate. Size 57 mm x 51 mm x 59 mm Cyst(s) Complex unilocular. Size 44.0 mm x 48.0 mm x 45 mm. Mean 45.7 mm. Vol 49.763 cm  Left Tube ======== Not visualized  Cul de Sac ========= Normal  Bladder ====== Visualized, Empty  Impression ========= Endovaginal sonogram was performed due to the indications outlined above.  The uterus is surgically absent. Vaginal cuff appears grossly normal.  The ovaries were both visualized. Right ovary appears unremarkable. Left ovary contains a complex unilocular cyst. Cyst appears anechoic and contains a hyperechoic solid mass measuring 21 x 10 x 21mm, suspected internal papillary projection. Vascularity seen within papillary projection.   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  No unintentional weight loss or current pain. She is actively trying to lose weight.  Past Medical History: has a past medical history of Abnormal Pap smear of vagina, Asthma, mild intermittent, well-controlled (HHS-HCC) (01/03/2014), B12 deficiency (06/19/2015), Colon polyp, Essential hypertension, benign (01/03/2014), Gout (11/16/2013), Hyperlipemia (11/13/2013), and Migraines (11/13/2013).  Past Surgical History: Abdominal hysterectomy (09/1984); Cholecystectomy (1997); Deep neck lymph node biopsy / excision (Right, 01/2006); Breast  excisional biopsy (1991); Incision Tendon Sheath For Trigger Finger (Right); Hemorrhoidectomy External; Trunk skin lesion excisional biopsy (Right); right knee arthroscopy, partial medial and lateral menisecectomy, chondroplasty of patellofemoral, medial and lateral compartments (Right, 09/29/2018); and Knee arthroscopy.  Social History: reports that she quit smoking about 30 years ago. Her smoking use included cigarettes. She started smoking about 55 years ago. She has been exposed to tobacco smoke. She has never used smokeless tobacco. She reports that she does not drink alcohol and does not use drugs.   Problem List: Patient Active Problem List   Diagnosis Date Noted   Ovarian cyst 10/22/2023    Past Medical History: Past Medical History:  Diagnosis Date   Asthma flare    with bad colds   COPD (chronic obstructive pulmonary disease) (HCC)    Frequent headaches    migraines.  none in 2 yrs   Hepatitis    1960s. treated   Hypertension    Sinus problem    Wears dentures    partial upper and lower    Past Surgical History: Past Surgical History:  Procedure Laterality Date   ABDOMINAL HYSTERECTOMY     BREAST BIOPSY Right 1996   neg   BREAST EXCISIONAL BIOPSY Right    DEEP NECK LYMPH NODE BIOPSY / EXCISION  01/2006   GALLBLADDER SURGERY     HEMORRHOID SURGERY     KNEE ARTHROSCOPY WITH MEDIAL MENISECTOMY Right 09/29/2018   Procedure: KNEE ARTHROSCOPY WITH PARTIAL MEDIAL AND LATERAL MENISECTOMY SYNOVECTEMY;  Surgeon: Tobie Priest, MD;  Location: [facility];  Service: Orthopedics;  Laterality: Right;   TRIGGER FINGER RELEASE Right         Family History: Family History  Problem Relation Age of Onset   Cancer Father    Breast cancer Maternal Aunt     Social History: Social History   Socioeconomic History   Marital status: Married    Spouse name: Not on file   Number of children: Not on file   Years of education: Not on file   Highest education level: Not  on file  Occupational History   Not on file  Tobacco Use   Smoking status: Former    Current packs/day: 0.00    Types: Cigarettes    Quit date: 07/09/1993    Years since quitting: 30.4   Smokeless tobacco: Never  Vaping Use   Vaping status: Never Used  Substance and Sexual Activity   Alcohol use: Not Currently    Comment: rarely   Drug use: No   Sexual activity: Not Currently    Birth control/protection: None  Other Topics Concern   Not on file  Social History Narrative   Not on file   Social Drivers of Health   Financial Resource Strain: Low Risk  (10/07/2023)   Received from [facility]   Overall Financial Resource Strain (CARDIA)    Difficulty of Paying Living Expenses: Not hard at all  Food Insecurity: No Food Insecurity (10/07/2023)   Received from [facility]   Hunger Vital Sign    Within the past 12 months, you worried that your food would run out before you got the money to buy more.: Never true    Within the past 12 months, the food you bought just didn't last and you didn't have money to get more.: Never true  Transportation Needs: No Transportation  Needs (10/07/2023)   Received [facility] - [facility]    In the past 12 months, has lack of transportation kept you from medical appointments or from getting medications?: No    Lack of Transportation (Non-Medical): No  Physical Activity: Not on file  Stress: Not on file  Social Connections: Not on file  Intimate Partner Violence: Not on file    Allergies: Allergies  Allergen Reactions   Venlafaxine Nausea Only   Sulfa Antibiotics     Liver issues   Valacyclovir Nausea And Vomiting   Lisinopril Cough    Current Medications: Current Outpatient Medications  Medication Sig Dispense Refill   albuterol  (ACCUNEB ) 0.63 MG/3ML nebulizer solution Take 1 ampule by nebulization every 6 (six) hours as needed for wheezing.     albuterol  (VENTOLIN   HFA) 108 (90 Base) MCG/ACT inhaler Inhale 2 puffs into the lungs every 4 (four) hours as needed for wheezing or shortness of breath. 18 g 0   ascorbic acid (VITAMIN C) 1000 MG tablet Take 1,000 mg by mouth.     atenolol-chlorthalidone (TENORETIC) 50-25 MG tablet Take 0.5 tablets by mouth.      Cholecalciferol (VITAMIN D3) 2000 units capsule Take by mouth.     etodolac (LODINE) 400 MG tablet Take 400 mg by mouth.     losartan (COZAAR) 50 MG tablet Take by mouth.     meloxicam (MOBIC) 7.5 MG tablet Take 7.5 mg by mouth 2 (two) times daily.     montelukast (SINGULAIR) 10 MG tablet Take 10 mg by mouth daily.     pravastatin (PRAVACHOL) 20 MG tablet Take by mouth.     vitamin B-12 (CYANOCOBALAMIN) 1000 MCG tablet Take by mouth.     amoxicillin -clavulanate (AUGMENTIN ) 875-125 MG tablet Take 1 tablet by mouth every 12 (twelve) hours. (Patient not taking: Reported on 12/10/2023) 14 tablet 0   butalbital-aspirin -caffeine (FIORINAL) 50-325-40 MG capsule Take by mouth. (Patient not taking: Reported on 12/10/2023)     promethazine -dextromethorphan (PROMETHAZINE -DM) 6.25-15 MG/5ML syrup Take 5 mLs by mouth 4 (four) times daily as needed for cough. (Patient not taking: Reported on 12/10/2023) 118 mL 0   Spacer/Aero-Holding Chambers (AEROCHAMBER MV) inhaler Use as instructed (Patient not taking: Reported on 12/10/2023) 1 each 1   Spacer/Aero-Holding Chambers (AEROCHAMBER PLUS) inhaler Use with inhaler (Patient not taking: Reported on 12/10/2023) 1 each 2   No current facility-administered medications for this visit.    Review of Systems General: no complaints  HEENT: no complaints  Lungs: no complaints  Cardiac: no complaints  GI: no complaints  GU: no complaints  Musculoskeletal: no complaints  Extremities: no complaints  Skin: no complaints  Neuro: no complaints  Endocrine: no complaints  Psych: no complaints       Objective:  Physical Examination:  BP (!) 185/71   Pulse 63   Temp 97.8 F (36.6  C)   Resp 20   Wt 187 lb 8 oz (85 kg)   SpO2 100%   BMI 34.29 kg/m    ECOG Performance Status: 0 - Asymptomatic  GENERAL: Patient is a well appearing female in no acute distress HEENT:  Atraumatic and normocephalic. PERRL, neck supple. LUNGS: Normal respiratory effort ABDOMEN:  Soft, nontender. Nondistended. No masses/ascites/or hepatomegaly.  EXTREMITIES:  No peripheral edema.   NEURO:  Nonfocal. Well oriented.  Appropriate affect.  Pelvic: EGBUS: no lesions Cervix: surgically absent Vagina: no lesions, no discharge or bleeding Uterus: surgically absent BME: vaginal cuff fullness, smooth, no palpable  masses or nodularity Rectovaginal: deferred   Labs Lab Results  Component Value Date   WBC 8.9 09/05/2023   HGB 12.9 09/05/2023   HCT 37.8 09/05/2023   MCV 93.8 09/05/2023   PLT 134 (L) 09/05/2023   Lab Results  Component Value Date   NA 137 09/05/2023   CL 104 09/05/2023   K 3.6 09/05/2023   CO2 26 09/05/2023   BUN 26 (H) 09/05/2023   CREATININE 1.08 (H) 09/05/2023   GFRNONAA 53 (L) 09/05/2023   CALCIUM 9.1 09/05/2023   ALBUMIN 3.7 09/05/2023   GLUCOSE 101 (H) 09/05/2023   Lab Results  Component Value Date   LIPASE 35 09/05/2023   Lab Results  Component Value Date   ALT 17 09/05/2023   AST 32 09/05/2023   ALKPHOS 62 09/05/2023   BILITOT 2.0 (H) 09/05/2023      Radiologic Imaging: CT scan 09/05/23 FINDINGS: Lower chest: No acute abnormality.   Hepatobiliary: Nodular hepatic contours suggesting hepatic cirrhosis. Status post cholecystectomy. No biliary dilatation.   Pancreas: Unremarkable. No pancreatic ductal dilatation or surrounding inflammatory changes.   Spleen: Normal in size without focal abnormality.   Adrenals/Urinary Tract: Adrenal glands are unremarkable. Kidneys are normal, without renal calculi, focal lesion, or hydronephrosis. Bladder is unremarkable.   Stomach/Bowel: The stomach and appendix are unremarkable. There is no  evidence of bowel obstruction. Moderate focal diverticulitis of descending colon is noted without abscess formation.   Vascular/Lymphatic: Aortic atherosclerosis. No enlarged abdominal or pelvic lymph nodes.   Reproductive: Status post hysterectomy. 5.4 cm left ovarian cyst is noted.   Other: No ascites or hernia.   Musculoskeletal: No acute or significant osseous findings.   IMPRESSION: Moderate focal diverticulitis of descending colon without abscess formation.   Nodular hepatic contours suggesting hepatic cirrhosis.   5.4 cm left adnexal cyst. Recommend follow-up US  in 6-12 months. Note: This recommendation does not apply to premenarchal patients and to those with increased risk (genetic, family history, elevated tumor markers or other high-risk factors) of ovarian cancer. Reference: JACR 2020 Feb; 17(2):248-254.  10/27/2023  TRANSABDOMINAL AND TRANSVAGINAL ULTRASOUND OF PELVIS    COMPARISON:  None Available.   FINDINGS:   Right ovary: Not visualized. No adnexal mass. A a ovoid loculated fluid collection is seen within right adnexa which may represent a mesenteric or paraovarian cyst. This measures 6.1 cm in greatest dimension.   Left ovary: Measurements: 5.2 x 4.5 x 4.4 cm = volume: 53 mL. The left ovary is entirely replaced by a simple cyst. No solid adnexal mass identified.   Other findings   No abnormal free fluid.   IMPRESSION: 1. 6.1 cm loculated fluid collection within the right adnexa which may represent a mesenteric or paraovarian cyst. 2. 5.3 cm simple cyst within the left ovary. This is best characterized as a O-Rads category 2 cyst. Recommend follow-up US  in 12 months.   Lab Review    Chemistry      Component Value Date/Time   NA 137 09/05/2023 1001   K 3.6 09/05/2023 1001   CL 104 09/05/2023 1001   CO2 26 09/05/2023 1001   BUN 26 (H) 09/05/2023 1001   CREATININE 1.08 (H) 09/05/2023 1001      Component Value Date/Time   CALCIUM 9.1  09/05/2023 1001   ALKPHOS 62 09/05/2023 1001   AST 32 09/05/2023 1001   ALT 17 09/05/2023 1001   BILITOT 2.0 (H) 09/05/2023 1001      Lab Results  Component Value Date   WBC  8.9 09/05/2023   HGB 12.9 09/05/2023   HCT 37.8 09/05/2023   MCV 93.8 09/05/2023   PLT 134 (L) 09/05/2023    Assessment:  Caitlin House is a 78 y.o. female diagnosed with 5.9 cm unilocular left ovarian cyst on CT scan 09/05/23 for work up of left pelvic pain a week post colonoscopy.  Diagnosed with diverticulitis and symptoms resolved with antibiotics. Seen by Dr Lovetta 10/07/23 CA125 = 43.7, HE4 = 119 TVUS  in office showed left ovary contains a complex unilocular cyst. Cyst appears anechoic and contains a hyperechoic solid mass measuring 21 x 10 x 21mm, suspected internal papillary projection. Vascularity seen within papillary projection.  No prior abdominal imaging for comparison. Follow up US  ~5 cm left appears to be a simple cyst; 6.1 cm fluid collection right adnexa. Asymptomatic.  Tumor markers mildly elevated, but this also could be due to diverticulitis and/or renal dysfunction.  ROMA 40%   Mild azotemia  Elevated bilirubin, uncertain etiology  Medical co-morbidities complicating care: hypertension, benign (01/03/2014), Gout (11/16/2013), Hyperlipemia (11/13/2013), and Migraines (11/13/2013), COPD.  Plan:   Problem List Items Addressed This Visit       Endocrine   Ovarian cyst - Primary   Other Visit Diagnoses       Elevated bilirubin         Counseling and coordination of care           Her ROMA is elevated and she has adnexal masses on imaging. The left appears to be a simple cyst. The right adnexal cyst is more challenging to characterize on review of the images. We discussed observation vs surgery vs IR guided cytology. Suggested further discussion with Dr. Mancil before we proceed with next steps.   Her creatinine is slightly increased which may alter the accuracy of the HE4  testing.     Elevated bilirubin, recheck labs  She had COPD. Uncertain how many flights of stairs she can walk up. She can walk around [place] without getting short of breath. If surgery is recommended she will need preoperative clearance.   The patient's diagnosis, an outline of the further diagnostic and laboratory studies which will be required, the recommendation, and alternatives were discussed.  All questions were answered to the patient's satisfaction.  A total of 25 minutes were spent with the patient/family today; 50% was spent in education, counseling and coordination of care for left ovarian mass.    Tammela Bales Isidor Constable, MD

## 2023-12-18 ENCOUNTER — Other Ambulatory Visit: Payer: Self-pay

## 2023-12-18 ENCOUNTER — Telehealth: Payer: Self-pay | Admitting: Obstetrics and Gynecology

## 2023-12-18 DIAGNOSIS — N83202 Unspecified ovarian cyst, left side: Secondary | ICD-10-CM

## 2023-12-18 NOTE — Telephone Encounter (Signed)
 US  and GYN appts in Jan.  I called and spoke with pt, confirmed dates and times of appts. Pt stated she wrote them down and I am mailing AVS per pt request.

## 2023-12-19 ENCOUNTER — Other Ambulatory Visit: Payer: Self-pay

## 2023-12-19 ENCOUNTER — Telehealth: Payer: Self-pay

## 2023-12-19 DIAGNOSIS — N83209 Unspecified ovarian cyst, unspecified side: Secondary | ICD-10-CM

## 2023-12-19 NOTE — Telephone Encounter (Signed)
 Spoke with patient and let her know dr mancil recommendations (plan to see her back with US  and tumor markers in a few months.) Patient agreed and understood. Orders are placed.

## 2024-01-23 ENCOUNTER — Other Ambulatory Visit: Payer: Self-pay | Admitting: Internal Medicine

## 2024-01-23 DIAGNOSIS — Z1231 Encounter for screening mammogram for malignant neoplasm of breast: Secondary | ICD-10-CM

## 2024-02-28 ENCOUNTER — Encounter: Payer: Self-pay | Admitting: Emergency Medicine

## 2024-02-28 ENCOUNTER — Ambulatory Visit

## 2024-02-28 ENCOUNTER — Ambulatory Visit
Admission: EM | Admit: 2024-02-28 | Discharge: 2024-02-28 | Disposition: A | Discharge status: Home / Self Care | Attending: Emergency Medicine | Admitting: Emergency Medicine

## 2024-02-28 DIAGNOSIS — M25571 Pain in right ankle and joints of right foot: Secondary | ICD-10-CM | POA: Diagnosis not present

## 2024-02-28 NOTE — ED Triage Notes (Signed)
 Pt c/o right ankle pain. Started about 2-3 days. She states she was going down a ramp at work when she noticed.

## 2024-02-28 NOTE — ED Provider Notes (Signed)
 " [facility]    CSN: [id number] Arrival date & time: 02/28/24  1047      History   Chief Complaint Chief Complaint  Patient presents with   Ankle Pain    right    HPI Caitlin House is a 78 y.o. female.   78 year old female, Caitlin House, presents to urgent care for evaluation of right ankle pain that started 2 to 3 days prior.  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  The history is provided by the patient. No language interpreter was used.  Ankle Pain Associated symptoms: no fever     Past Medical History:  Diagnosis Date   Asthma flare    with bad colds   COPD (chronic obstructive pulmonary disease) (HCC)    Frequent headaches    migraines.  none in 2 yrs   Hepatitis    1960s. treated   Hypertension    Sinus problem    Wears dentures    partial upper and lower    Patient Active Problem List   Diagnosis Date Noted   Acute right ankle pain 02/28/2024   Ovarian cyst 10/22/2023    Past Surgical History:  Procedure Laterality Date   ABDOMINAL HYSTERECTOMY     BREAST BIOPSY Right 1996   neg   BREAST EXCISIONAL BIOPSY Right    DEEP NECK LYMPH NODE BIOPSY / EXCISION  01/2006   GALLBLADDER SURGERY     HEMORRHOID SURGERY     KNEE ARTHROSCOPY WITH MEDIAL MENISECTOMY Right 09/29/2018   Procedure: KNEE ARTHROSCOPY WITH PARTIAL MEDIAL AND LATERAL MENISECTOMY SYNOVECTEMY;  Surgeon: Tobie Priest, MD;  Location: [facility];  Service: Orthopedics;  Laterality: Right;   TRIGGER FINGER RELEASE Right     OB History   No obstetric history on file.      Home Medications    Prior to Admission medications  Medication Sig Start Date End Date Taking? Authorizing Provider  albuterol  (ACCUNEB ) 0.63 MG/3ML nebulizer solution Take 1 ampule by nebulization every 6 (six) hours as needed for wheezing.    [provider]  albuterol  (VENTOLIN  HFA) 108 (90 Base) MCG/ACT inhaler Inhale 2 puffs into the lungs every 4 (four) hours as needed for wheezing or shortness of breath. 02/28/23   Van Knee, MD  amoxicillin -clavulanate (AUGMENTIN ) 875-125 MG tablet Take 1 tablet by mouth every 12 (twelve) hours. Patient not taking: No sig reported 02/28/23   Van Knee, MD  ascorbic acid (VITAMIN C) 1000 MG tablet Take 1,000 mg by mouth.    [provider]  atenolol-chlorthalidone (TENORETIC) 50-25 MG tablet Take 0.5 tablets by mouth.  05/14/16   [provider]  butalbital-aspirin -caffeine (FIORINAL) 50-325-40 MG capsule Take by mouth. Patient not taking: Reported on 12/10/2023 05/13/16   [provider]  Cholecalciferol (VITAMIN D3) 2000 units capsule Take by mouth.    [provider]  etodolac (LODINE) 400 MG tablet Take 400 mg by mouth. 12/08/23 12/07/24  [provider]  losartan (COZAAR) 50 MG tablet Take by mouth. 10/28/16   [provider]  meloxicam (MOBIC) 7.5 MG tablet Take 7.5 mg by mouth 2 (two) times daily.    [provider]  montelukast (SINGULAIR) 10 MG tablet Take 10 mg by mouth daily.    [provider]  pravastatin (PRAVACHOL) 20 MG tablet Take by mouth. 08/27/16  [provider]  promethazine -dextromethorphan (PROMETHAZINE -DM) 6.25-15 MG/5ML syrup Take 5 mLs by mouth 4 (four) times daily as needed for cough. Patient not taking: No sig reported 02/28/23   Van Knee, MD  Spacer/Aero-Holding Chambers (AEROCHAMBER MV) inhaler Use as instructed Patient not taking: No sig reported 02/28/23   Van Knee, MD  Spacer/Aero-Holding Chambers (AEROCHAMBER PLUS) inhaler Use with inhaler Patient not taking: No sig reported 01/17/21   Van Knee, MD  vitamin B-12 (CYANOCOBALAMIN) 1000 MCG tablet Take by mouth.    [provider]    Family History Family History  Problem Relation Age of  Onset   Cancer Father    Breast cancer Maternal Aunt     Social History Social History[1]   Allergies   Venlafaxine, Sulfa antibiotics, Valacyclovir, and Lisinopril   Review of Systems Review of Systems  Constitutional:  Negative for fever.  Musculoskeletal:  Positive for arthralgias, gait problem, joint swelling and myalgias.  All other systems reviewed and are negative.    Physical Exam Triage Vital Signs ED Triage Vitals  Encounter Vitals Group     BP 02/28/24 1222 (!) 187/69     Girls Systolic BP Percentile --      Girls Diastolic BP Percentile --      Boys Systolic BP Percentile --      Boys Diastolic BP Percentile --      Pulse Rate 02/28/24 1222 60     Resp 02/28/24 1222 16     Temp 02/28/24 1222 98 F (36.7 C)     Temp Source 02/28/24 1222 Oral     SpO2 02/28/24 1222 100 %     Weight 02/28/24 1219 187 lb 6.3 oz (85 kg)     Height 02/28/24 1219 5' 2 (1.575 m)     Head Circumference --      Peak Flow --      Pain Score 02/28/24 1219 10     Pain Loc --      Pain Education --      Exclude from Growth Chart --    No data found.  Updated Vital Signs BP (!) 187/69 (BP Location: Right Arm)   Pulse 60   Temp 98 F (36.7 C) (Oral)   Resp 16   Ht 5' 2 (1.575 m)   Wt 187 lb 6.3 oz (85 kg)   SpO2 100%   BMI 34.27 kg/m   Visual Acuity Right Eye Distance:   Left Eye Distance:   Bilateral Distance:    Right Eye Near:   Left Eye Near:    Bilateral Near:     Physical Exam Vitals and nursing note reviewed.  Constitutional:      General: She is awake.     Appearance: She is well-developed and well-groomed.  Cardiovascular:     Rate and Rhythm: Normal rate.     Pulses:          Dorsalis pedis pulses are 2+ on the right side.  Pulmonary:     Effort: Pulmonary effort is normal.  Musculoskeletal:     Right ankle: Tenderness present over the lateral malleolus, medial malleolus and ATF ligament. Normal pulse.  Skin:    General: Skin is warm.   Neurological:     General: No focal deficit present.     Mental Status: She is alert and oriented to person, place, and time.  Psychiatric:        Behavior: Behavior is cooperative.      UC Treatments /  Results  Labs (all labs ordered are listed, but only abnormal results are displayed) Labs Reviewed - No data to display  EKG   Radiology DG Ankle Complete Right Result Date: 02/28/2024 EXAM: 3 OR MORE VIEW(S) XRAY OF THE RIGHT ANKLE 02/28/2024 12:36:00 PM CLINICAL HISTORY: right ankle pain COMPARISON: None available. FINDINGS: BONES AND JOINTS: No acute fracture. No malalignment. Superior and plantar calcaneal spurs. Anterior joint effusion identified. SOFT TISSUES: Mild diffuse soft tissue swelling. IMPRESSION: 1. Anterior joint effusion and mild diffuse soft tissue swelling. 2. No acute osseous findings. Electronically signed by: Waddell Calk MD 02/28/2024 12:43 PM EST RP Workstation: [id number]    Procedures Procedures (including critical care time)  Medications Ordered in UC Medications - No data to display  Initial Impression / Assessment and Plan / UC Course  I have reviewed the triage vital signs and the nursing notes.  Pertinent labs & imaging results that were available during my care of the patient were reviewed by me and considered in my medical decision making (see chart for details).    Discussed exam findings and plan of care with patient, ace wrap applied by staff, referred to Ortho for follow up, strict go to ER precautions given.  Tylenol  as label directed for pain. Patient verbalized understanding to this provider.  Ddx: Acute right ankle pain,sprain,fracture,dislocation Final Clinical Impressions(s) / UC Diagnoses   Final diagnoses:  Acute right ankle pain     Discharge Instructions      Xray shows swelling,most consistent with ankle sprain Rest,ice,elevate,wear ace wrap Tylenol  for pain as label directed Follow up with [facility]-call for  appt      ED Prescriptions   None    PDMP not reviewed this encounter.     [1]  Social History Tobacco Use   Smoking status: Former    Current packs/day: 0.00    Types: Cigarettes    Quit date: 07/09/1993    Years since quitting: 30.6   Smokeless tobacco: Never  Vaping Use   Vaping status: Never Used  Substance Use Topics   Alcohol use: Not Currently    Comment: rarely   Drug use: No     Martasia Talamante, Rilla, NP 02/28/24 1356  "

## 2024-02-28 NOTE — Discharge Instructions (Addendum)
 Xray shows swelling,most consistent with ankle sprain Rest,ice,elevate,wear ace wrap Tylenol  for pain as label directed Follow up with [facility]-call for appt

## 2024-03-01 ENCOUNTER — Ambulatory Visit
Admission: RE | Admit: 2024-03-01 | Discharge: 2024-03-01 | Disposition: A | Discharge status: Home / Self Care | Source: Ambulatory Visit | Attending: Internal Medicine | Admitting: Internal Medicine

## 2024-03-01 DIAGNOSIS — Z1231 Encounter for screening mammogram for malignant neoplasm of breast: Secondary | ICD-10-CM | POA: Diagnosis present

## 2024-03-08 ENCOUNTER — Other Ambulatory Visit: Payer: Self-pay | Admitting: Specialist

## 2024-03-08 DIAGNOSIS — R918 Other nonspecific abnormal finding of lung field: Secondary | ICD-10-CM

## 2024-03-17 ENCOUNTER — Inpatient Hospital Stay: Attending: Obstetrics and Gynecology

## 2024-03-17 ENCOUNTER — Ambulatory Visit
Admission: RE | Admit: 2024-03-17 | Discharge: 2024-03-17 | Disposition: A | Discharge status: Home / Self Care | Source: Ambulatory Visit | Attending: Nurse Practitioner | Admitting: Nurse Practitioner

## 2024-03-17 DIAGNOSIS — Z882 Allergy status to sulfonamides status: Secondary | ICD-10-CM | POA: Insufficient documentation

## 2024-03-17 DIAGNOSIS — Z79899 Other long term (current) drug therapy: Secondary | ICD-10-CM | POA: Insufficient documentation

## 2024-03-17 DIAGNOSIS — K5792 Diverticulitis of intestine, part unspecified, without perforation or abscess without bleeding: Secondary | ICD-10-CM | POA: Insufficient documentation

## 2024-03-17 DIAGNOSIS — Z90722 Acquired absence of ovaries, bilateral: Secondary | ICD-10-CM | POA: Insufficient documentation

## 2024-03-17 DIAGNOSIS — J4520 Mild intermittent asthma, uncomplicated: Secondary | ICD-10-CM | POA: Insufficient documentation

## 2024-03-17 DIAGNOSIS — R978 Other abnormal tumor markers: Secondary | ICD-10-CM | POA: Insufficient documentation

## 2024-03-17 DIAGNOSIS — M109 Gout, unspecified: Secondary | ICD-10-CM | POA: Insufficient documentation

## 2024-03-17 DIAGNOSIS — Z8719 Personal history of other diseases of the digestive system: Secondary | ICD-10-CM | POA: Insufficient documentation

## 2024-03-17 DIAGNOSIS — N83202 Unspecified ovarian cyst, left side: Secondary | ICD-10-CM | POA: Insufficient documentation

## 2024-03-17 DIAGNOSIS — N83292 Other ovarian cyst, left side: Secondary | ICD-10-CM | POA: Insufficient documentation

## 2024-03-17 DIAGNOSIS — K5732 Diverticulitis of large intestine without perforation or abscess without bleeding: Secondary | ICD-10-CM | POA: Insufficient documentation

## 2024-03-17 DIAGNOSIS — Z87891 Personal history of nicotine dependence: Secondary | ICD-10-CM | POA: Insufficient documentation

## 2024-03-17 DIAGNOSIS — Z9049 Acquired absence of other specified parts of digestive tract: Secondary | ICD-10-CM | POA: Insufficient documentation

## 2024-03-17 DIAGNOSIS — Z803 Family history of malignant neoplasm of breast: Secondary | ICD-10-CM | POA: Insufficient documentation

## 2024-03-17 DIAGNOSIS — Z90710 Acquired absence of both cervix and uterus: Secondary | ICD-10-CM | POA: Insufficient documentation

## 2024-03-17 DIAGNOSIS — Z860100 Personal history of colon polyps, unspecified: Secondary | ICD-10-CM | POA: Insufficient documentation

## 2024-03-17 DIAGNOSIS — Z888 Allergy status to other drugs, medicaments and biological substances status: Secondary | ICD-10-CM | POA: Insufficient documentation

## 2024-03-17 DIAGNOSIS — Z809 Family history of malignant neoplasm, unspecified: Secondary | ICD-10-CM | POA: Insufficient documentation

## 2024-03-23 ENCOUNTER — Encounter: Payer: Self-pay | Admitting: Podiatry

## 2024-03-23 ENCOUNTER — Ambulatory Visit: Admitting: Podiatry

## 2024-03-23 ENCOUNTER — Telehealth: Payer: Self-pay

## 2024-03-23 ENCOUNTER — Ambulatory Visit (INDEPENDENT_AMBULATORY_CARE_PROVIDER_SITE_OTHER)

## 2024-03-23 VITALS — Ht 62.0 in | Wt 187.4 lb

## 2024-03-23 DIAGNOSIS — M7751 Other enthesopathy of right foot: Secondary | ICD-10-CM | POA: Diagnosis not present

## 2024-03-23 DIAGNOSIS — M76821 Posterior tibial tendinitis, right leg: Secondary | ICD-10-CM | POA: Diagnosis not present

## 2024-03-23 MED ORDER — BETAMETHASONE SOD PHOS & ACET 6 (3-3) MG/ML IJ SUSP: 3.0000 mg | Freq: Once | INTRAMUSCULAR | Status: AC

## 2024-03-23 NOTE — Telephone Encounter (Signed)
 Radiology called for read on 03/17/24 US  prior to appointment tomorrow.

## 2024-03-23 NOTE — Progress Notes (Signed)
 "  Chief Complaint  Patient presents with   Ankle Pain    Pt is here due to right ankle pain, she states has been going for about a month, has been seen by other doctors for this issue and was told several different things  wanted to go to a professional to see what is really going on.    HPI: 79 y.o. femalepresenting for evaluation of right ankle pain ongoing for about 1 month now.  She has seen different physicians and was told different diagnoses.  Presenting today for follow-up.  Over the last few days she has noticed significant reduction in her pain  Past Medical History:  Diagnosis Date   Asthma flare    with bad colds   COPD (chronic obstructive pulmonary disease) (HCC)    Frequent headaches    migraines.  none in 2 yrs   Hepatitis    1960s. treated   Hypertension    Sinus problem    Wears dentures    partial upper and lower    Past Surgical History:  Procedure Laterality Date   ABDOMINAL HYSTERECTOMY     BREAST BIOPSY Right 1996   neg   BREAST EXCISIONAL BIOPSY Right    DEEP NECK LYMPH NODE BIOPSY / EXCISION  01/2006   GALLBLADDER SURGERY     HEMORRHOID SURGERY     KNEE ARTHROSCOPY WITH MEDIAL MENISECTOMY Right 09/29/2018   Procedure: KNEE ARTHROSCOPY WITH PARTIAL MEDIAL AND LATERAL MENISECTOMY SYNOVECTEMY;  Surgeon: Tobie Priest, MD;  Location: [facility];  Service: Orthopedics;  Laterality: Right;   TRIGGER FINGER RELEASE Right     Allergies[1]   Physical Exam: General: The patient is alert and oriented x3 in no acute distress.  Dermatology: Skin is warm, dry and supple bilateral lower extremities.   Vascular: Palpable pedal pulses bilaterally. Capillary refill within normal limits.  No appreciable edema.  No erythema.  Neurological: Grossly intact via light touch  Musculoskeletal Exam: No pedal deformities noted.  Tenderness to palpation noted along the posterior tibial tendon just posterior to the medial malleolus consistent with PT  tendinitis.  Radiographic Exam RT foot 03/23/2024:  Normal osseous mineralization. Joint spaces preserved.  No fractures or irregularities noted.  Impression: Negative  Assessment/Plan of Care: 1.  Posterior tibial tendinitis right  - Patient evaluated.  X-rays reviewed -Injection of 0.5 cc Celestone  Soluspan injected around the posterior tibial tendon right just posterior to the medial malleolus -Patient currently taking meloxicam 7.5 mg twice daily as well as etodolac 400 mg daily prescribed from her PCP.  Recommend taking one of the other but not both -Compression ankle sleeve dispensed -Prior physician recommended Dr. Heriberto arch supports and she states that they do help alleviate some of her pain.  Continue with good supportive tennis shoes and sneakers -Refrain from going barefoot -Return to clinic 4 weeks  Thresa EMERSON Sar, DPM [facility]  Dr. Thresa EMERSON Sar, DPM    [address] [place], [place] [address]                Office [phone]  Fax [phone]        [1]  Allergies Allergen Reactions   Venlafaxine Nausea Only   Sulfa Antibiotics  Liver issues   Valacyclovir Nausea And Vomiting   Lisinopril Cough   "

## 2024-03-24 ENCOUNTER — Inpatient Hospital Stay

## 2024-03-24 ENCOUNTER — Inpatient Hospital Stay: Admitting: Obstetrics and Gynecology

## 2024-03-24 VITALS — BP 178/65 | HR 64 | Temp 97.7°F | Ht 62.0 in | Wt 196.0 lb

## 2024-03-24 DIAGNOSIS — Z90710 Acquired absence of both cervix and uterus: Secondary | ICD-10-CM | POA: Diagnosis not present

## 2024-03-24 DIAGNOSIS — N83202 Unspecified ovarian cyst, left side: Secondary | ICD-10-CM | POA: Diagnosis not present

## 2024-03-24 DIAGNOSIS — J4520 Mild intermittent asthma, uncomplicated: Secondary | ICD-10-CM | POA: Diagnosis not present

## 2024-03-24 DIAGNOSIS — Z882 Allergy status to sulfonamides status: Secondary | ICD-10-CM | POA: Diagnosis not present

## 2024-03-24 DIAGNOSIS — Z809 Family history of malignant neoplasm, unspecified: Secondary | ICD-10-CM | POA: Diagnosis not present

## 2024-03-24 DIAGNOSIS — Z8719 Personal history of other diseases of the digestive system: Secondary | ICD-10-CM | POA: Diagnosis not present

## 2024-03-24 DIAGNOSIS — K5792 Diverticulitis of intestine, part unspecified, without perforation or abscess without bleeding: Secondary | ICD-10-CM | POA: Diagnosis not present

## 2024-03-24 DIAGNOSIS — Z90722 Acquired absence of ovaries, bilateral: Secondary | ICD-10-CM | POA: Diagnosis not present

## 2024-03-24 DIAGNOSIS — Z860100 Personal history of colon polyps, unspecified: Secondary | ICD-10-CM | POA: Diagnosis not present

## 2024-03-24 DIAGNOSIS — N83292 Other ovarian cyst, left side: Secondary | ICD-10-CM | POA: Diagnosis not present

## 2024-03-24 DIAGNOSIS — Z79899 Other long term (current) drug therapy: Secondary | ICD-10-CM | POA: Diagnosis not present

## 2024-03-24 DIAGNOSIS — Z803 Family history of malignant neoplasm of breast: Secondary | ICD-10-CM | POA: Diagnosis not present

## 2024-03-24 DIAGNOSIS — Z87891 Personal history of nicotine dependence: Secondary | ICD-10-CM | POA: Diagnosis not present

## 2024-03-24 DIAGNOSIS — R978 Other abnormal tumor markers: Secondary | ICD-10-CM | POA: Diagnosis present

## 2024-03-24 DIAGNOSIS — Z9049 Acquired absence of other specified parts of digestive tract: Secondary | ICD-10-CM | POA: Diagnosis not present

## 2024-03-24 DIAGNOSIS — Z888 Allergy status to other drugs, medicaments and biological substances status: Secondary | ICD-10-CM | POA: Diagnosis not present

## 2024-03-24 DIAGNOSIS — M109 Gout, unspecified: Secondary | ICD-10-CM | POA: Diagnosis not present

## 2024-03-24 DIAGNOSIS — K5732 Diverticulitis of large intestine without perforation or abscess without bleeding: Secondary | ICD-10-CM | POA: Diagnosis not present

## 2024-03-24 NOTE — Progress Notes (Signed)
 Gynecologic Oncology Interval Visit   Referring Provider: Dr Lovetta  Chief Concern: Bilateral ovarian cystic lesions, ORADS 2, mildly elevated tumor markers Subjective:  Caitlin House is a 79 y.o. female who is seen in consultation from Dr. Lovetta for incidentally found left ovarian cyst and elevated tumor markers.   Returns today for follow up. She returns to clinic after Radiology US  done with ORADS score 2.  She has no concerning complaints.   12/03/2023 CA125 43.0 and HE4 119.0,  ROMA 40.84%  TVUS 03/18/24 Uterus Surgically absent.   Right ovary Right ovary is not distinctly visualized. However, there is a 2.3 x 3.3 x 4.8 cm, thin/imperceptible walled anechoic fluid collection in the right adnexa. When compared to the prior ultrasound, this has decreased in size (previously measured 2.4 x 3.6 x 6.1 cm). This area was present on the prior CT scan from 09/05/2023 as well. This is indeterminate but favored benign in etiology. Differential diagnosis includes ovarian etiology (such as senescent cyst), paraovarian/paratubal cyst, peritoneal inclusion cyst, etc. O rads score - 2.   Left ovary Left ovary is not distinctly visualized. However, there is a 4.5 x 5.3 x 4.7 cm anechoic lesion in the left adnexa. No mural nodularity or internal vascularity. No significant interval change since the prior study. O rads score - 2.   Other findings No abnormal free fluid.   IMPRESSION: Bilateral adnexal cystic lesions, as described above. Follow-up examination is recommended in 12 months to document stability.  Gynecologic Oncology History Caitlin House is a pleasant female who is seen in consultation from Dr. Cleotilde for incidentally found left ovarian cyst and elevated tumor markers.   Saw Dr Lovetta 10/07/23. On September 05, 2023, she underwent a colonoscopy. Six days later, she experienced severe pain in the left lower quadrant. She visited a walk-in  clinic where an x-ray was performed, but she lacked the necessary equipment for a comprehensive evaluation, leading to a referral to the emergency department. At the emergency department, she was diagnosed with diverticulitis and started on antibiotics. During this visit, an incidental finding of a cyst on her left ovary was noted. The pain has since resolved completely. She has a history of a hysterectomy performed over 35 years ago due to heavy menstrual bleeding, but her ovaries were preserved. She has been pregnant three times and has two children, both delivered vaginally. No family history of gynecologic cancer.  10/07/23 CA125 43.7, HE4 119.  Pelvic US  10/07/23 Indication: Endovaginal imaging was necessary to evaluate the ovaries.    Uterus ====== Surgically Absent  Right Ovary ========= Visualized, Normal. Outline: smooth. Morphology: appropriate. Size 15 mm x 15 mm x 10 mm  No cysts identified  Right Tube ========= Not visualized  Left Ovary ======== Visualized, enlarged. Outline: smooth. Morphology: appropriate. Size 57 mm x 51 mm x 59 mm Cyst(s) Complex unilocular. Size 44.0 mm x 48.0 mm x 45 mm. Mean 45.7 mm. Vol 49.763 cm  Left Tube ======== Not visualized  Cul de Sac ========= Normal  Bladder ====== Visualized, Empty  Impression ========= Endovaginal sonogram was performed due to the indications outlined above.  The uterus is surgically absent. Vaginal cuff appears grossly normal.  The ovaries were both visualized. Right ovary appears unremarkable. Left ovary contains a complex unilocular cyst. Cyst appears anechoic and contains a hyperechoic solid mass measuring 21 x 10 x 21mm, suspected internal papillary projection. Vascularity seen within papillary projection.  TVUS 10/27/2023  FINDINGS: Right ovary: Not visualized. No adnexal mass.  A a ovoid loculated fluid collection is seen within right adnexa which may represent a mesenteric or paraovarian  cyst. This measures 6.1 cm in greatest dimension.   Left ovary: Measurements: 5.2 x 4.5 x 4.4 cm = volume: 53 mL. The left ovary is entirely replaced by a simple cyst. No solid adnexal mass identified.   IMPRESSION: 1. 6.1 cm loculated fluid collection within the right adnexa which may represent a mesenteric or paraovarian cyst.  2. 5.3 cm simple cyst within the left ovary. This is best characterized as a O-Rads category 2 cyst. Recommend follow-up US  in 12 months.  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  No unintentional weight loss or current pain. She is actively trying to lose weight.  Past Medical History: has a past medical history of Abnormal Pap smear of vagina, Asthma, mild intermittent, well-controlled (HHS-HCC) (01/03/2014), B12 deficiency (06/19/2015), Colon polyp, Essential hypertension, benign (01/03/2014), Gout (11/16/2013), Hyperlipemia (11/13/2013), and Migraines (11/13/2013).   Past Surgical History: Abdominal hysterectomy (09/1984); Cholecystectomy (1997); Deep neck lymph node biopsy / excision (Right, 01/2006); Breast excisional biopsy (1991); Incision Tendon Sheath For Trigger Finger (Right); Hemorrhoidectomy External; Trunk skin lesion excisional biopsy (Right); right knee arthroscopy, partial medial and lateral menisecectomy, chondroplasty of patellofemoral, medial and lateral compartments (Right, 09/29/2018); and Knee arthroscopy.  Social History: reports that she quit smoking about 30 years ago. Her smoking use included cigarettes. She started smoking about 55 years ago. She has been exposed to tobacco smoke. She has never used smokeless tobacco. She reports that she does not drink alcohol and does not use drugs.   Problem List: Patient Active Problem List   Diagnosis Date Noted   Acute right ankle pain 02/28/2024   Ovarian cyst 10/22/2023    Past Medical History: Past Medical History:  Diagnosis Date   Asthma  flare    with bad colds   COPD (chronic obstructive pulmonary disease) (HCC)    Frequent headaches    migraines.  none in 2 yrs   Hepatitis    1960s. treated   Hypertension    Sinus problem    Wears dentures    partial upper and lower    Past Surgical History: Past Surgical History:  Procedure Laterality Date   ABDOMINAL HYSTERECTOMY     BREAST BIOPSY Right 1996   neg   BREAST EXCISIONAL BIOPSY Right    DEEP NECK LYMPH NODE BIOPSY / EXCISION  01/2006   GALLBLADDER SURGERY     HEMORRHOID SURGERY     KNEE ARTHROSCOPY WITH MEDIAL MENISECTOMY Right 09/29/2018   Procedure: KNEE ARTHROSCOPY WITH PARTIAL MEDIAL AND LATERAL MENISECTOMY SYNOVECTEMY;  Surgeon: Tobie Priest, MD;  Location[facility];  Service: Orthopedics;  Laterality: Right;   TRIGGER FINGER RELEASE Right         Family History: Family History  Problem Relation Age of Onset   Cancer Father    Breast cancer Maternal Aunt     Social History: Social History   Socioeconomic History   Marital status: Married    Spouse name: Not on file   Number of children: Not on file   Years of education: Not on file   Highest education level: Not on file  Occupational History   Not on file  Tobacco Use   Smoking status: Former    Current packs/day: 0.00    Types: Cigarettes    Quit date: 07/09/1993    Years since  quitting: 30.7   Smokeless tobacco: Never  Vaping Use   Vaping status: Never Used  Substance and Sexual Activity   Alcohol use: Not Currently    Comment: rarely   Drug use: No   Sexual activity: Not Currently    Birth control/protection: None  Other Topics Concern   Not on file  Social History Narrative   Not on file   Social Drivers of Health   Tobacco Use: Medium Risk (03/24/2024)   Patient History    Smoking Tobacco Use: Former    Smokeless Tobacco Use: Never    Passive Exposure: Not on file  Financial Resource Strain: Low Risk  (10/07/2023)   [facility] from [facility]   Overall Financial Resource Strain (CARDIA)    Difficulty of Paying Living Expenses: Not hard at all  Food Insecurity: No Food Insecurity (10/07/2023)   Received from [facility]   Epic    Within the past 12 months, you worried that your food would run out before you got the money to buy more.: Never true    Within the past 12 months, the food you bought just didn't last and you didn't have money to get more.: Never true  Transportation Needs: No Transportation Needs (10/07/2023)   Received from [facility] - Transportation    In the past 12 months, has lack of transportation kept you from medical appointments or from getting medications?: No    Lack of Transportation (Non-Medical): No  Physical Activity: Not on file  Stress: Not on file  Social Connections: Not on file  Intimate Partner Violence: Not on file  Depression (EYV7-0): Not on file  Alcohol Screen: Not on file  Housing: Low Risk  (01/20/2024)   Received from [facility]   Epic    In the last 12 months, was there a time when you were not able to pay the mortgage or rent on time?: No    In the past 12 months, how many times have you moved where you were living?: 0    At any time in the past 12 months, were you homeless or living in a shelter (including now)?: No  Utilities: Not At Risk (10/07/2023)   Received from [facility]   Epic    In the past 12 months has the electric, gas, oil, or water company threatened to shut off services in your home?: No  Health Literacy: Not on file    Allergies: Allergies  Allergen Reactions   Venlafaxine Nausea Only   Sulfa Antibiotics     Liver issues   Valacyclovir Nausea And Vomiting   Lisinopril Cough    Current Medications: Current Outpatient Medications  Medication Sig Dispense Refill   albuterol  (ACCUNEB ) 0.63 MG/3ML nebulizer solution Take 1 ampule by nebulization every 6 (six) hours as  needed for wheezing.     albuterol  (VENTOLIN  HFA) 108 (90 Base) MCG/ACT inhaler Inhale 2 puffs into the lungs every 4 (four) hours as needed for wheezing or shortness of breath. 18 g 0   amoxicillin -clavulanate (AUGMENTIN ) 875-125 MG tablet Take 1 tablet by mouth every 12 (twelve) hours. (Patient not taking: No sig reported) 14 tablet 0   ascorbic acid (VITAMIN C) 1000 MG tablet Take 1,000 mg by mouth.     atenolol-chlorthalidone (TENORETIC) 50-25 MG tablet Take 0.5 tablets by mouth.      butalbital-aspirin -caffeine (FIORINAL) 50-325-40 MG capsule Take by mouth. (Patient not taking: Reported  on 12/10/2023)     Cholecalciferol (VITAMIN D3) 2000 units capsule Take by mouth.     etodolac (LODINE) 400 MG tablet Take 400 mg by mouth.     losartan (COZAAR) 50 MG tablet Take by mouth.     meloxicam (MOBIC) 7.5 MG tablet Take 7.5 mg by mouth 2 (two) times daily.     montelukast (SINGULAIR) 10 MG tablet Take 10 mg by mouth daily.     pravastatin (PRAVACHOL) 20 MG tablet Take by mouth.     promethazine -dextromethorphan (PROMETHAZINE -DM) 6.25-15 MG/5ML syrup Take 5 mLs by mouth 4 (four) times daily as needed for cough. (Patient not taking: No sig reported) 118 mL 0   Spacer/Aero-Holding Chambers (AEROCHAMBER MV) inhaler Use as instructed (Patient not taking: No sig reported) 1 each 1   Spacer/Aero-Holding Chambers (AEROCHAMBER PLUS) inhaler Use with inhaler (Patient not taking: No sig reported) 1 each 2   vitamin B-12 (CYANOCOBALAMIN) 1000 MCG tablet Take by mouth.     Current Facility-Administered Medications  Medication Dose Route Frequency Provider Last Rate Last Admin   betamethasone  acetate-betamethasone  sodium phosphate  (CELESTONE ) injection 3 mg  3 mg Intra-articular Once         Review of Systems General: no complaints  HEENT: no complaints  Lungs: no complaints  Cardiac: no complaints  GI: no complaints  GU: no complaints  Musculoskeletal: no complaints  Extremities: no complaints  Skin:  no complaints  Neuro: no complaints  Endocrine: no complaints  Psych: no complaints       Objective:  Physical Examination:  Ht 5' 2 (1.575 m)   BMI 34.27 kg/m    ECOG Performance Status: 0 - Asymptomatic  GENERAL: Patient is a well appearing female in no acute distress HEENT:  Atraumatic and normocephalic. PERRL, neck supple. LUNGS: Normal respiratory effort ABDOMEN:  Soft, nontender. Nondistended. No masses/ascites/or hepatomegaly.  EXTREMITIES:  No peripheral edema.   NEURO:  Nonfocal. Well oriented.  Appropriate affect.  Pelvic: Patient chooses to defer   Labs Lab Results  Component Value Date   WBC 8.9 09/05/2023   HGB 12.9 09/05/2023   HCT 37.8 09/05/2023   MCV 93.8 09/05/2023   PLT 134 (L) 09/05/2023   Lab Results  Component Value Date   NA 134 (L) 12/10/2023   CL 106 12/10/2023   K 4.0 12/10/2023   CO2 21 (L) 12/10/2023   BUN 44 (H) 12/10/2023   CREATININE 1.26 (H) 12/10/2023   GFRNONAA 44 (L) 12/10/2023   CALCIUM 9.3 12/10/2023   ALBUMIN 4.2 12/10/2023   GLUCOSE 191 (H) 12/10/2023   Lab Results  Component Value Date   LIPASE 35 09/05/2023   Lab Results  Component Value Date   ALT 18 12/10/2023   AST 30 12/10/2023   ALKPHOS 66 12/10/2023   BILITOT 1.1 12/10/2023    Radiologic Imaging: CT scan 09/05/23 FINDINGS: Lower chest: No acute abnormality.   Hepatobiliary: Nodular hepatic contours suggesting hepatic cirrhosis. Status post cholecystectomy. No biliary dilatation.   Pancreas: Unremarkable. No pancreatic ductal dilatation or surrounding inflammatory changes.   Spleen: Normal in size without focal abnormality.   Adrenals/Urinary Tract: Adrenal glands are unremarkable. Kidneys are normal, without renal calculi, focal lesion, or hydronephrosis. Bladder is unremarkable.   Stomach/Bowel: The stomach and appendix are unremarkable. There is no evidence of bowel obstruction. Moderate focal diverticulitis of descending colon is noted  without abscess formation.   Vascular/Lymphatic: Aortic atherosclerosis. No enlarged abdominal or pelvic lymph nodes.   Reproductive: Status post hysterectomy. 5.4 cm  left ovarian cyst is noted.   Other: No ascites or hernia.   Musculoskeletal: No acute or significant osseous findings.   IMPRESSION: Moderate focal diverticulitis of descending colon without abscess formation.   Nodular hepatic contours suggesting hepatic cirrhosis.   5.4 cm left adnexal cyst. Recommend follow-up US  in 6-12 months. Note: This recommendation does not apply to premenarchal patients and to those with increased risk (genetic, family history, elevated tumor markers or other high-risk factors) of ovarian cancer. Reference: JACR 2020 Feb; 17(2):248-254.  10/27/2023  TRANSABDOMINAL AND TRANSVAGINAL ULTRASOUND OF PELVIS    COMPARISON:  None Available.   FINDINGS:   Right ovary: Not visualized. No adnexal mass. A a ovoid loculated fluid collection is seen within right adnexa which may represent a mesenteric or paraovarian cyst. This measures 6.1 cm in greatest dimension.   Left ovary: Measurements: 5.2 x 4.5 x 4.4 cm = volume: 53 mL. The left ovary is entirely replaced by a simple cyst. No solid adnexal mass identified.   Other findings   No abnormal free fluid.   IMPRESSION: 1. 6.1 cm loculated fluid collection within the right adnexa which may represent a mesenteric or paraovarian cyst. 2. 5.3 cm simple cyst within the left ovary. This is best characterized as a O-Rads category 2 cyst. Recommend follow-up US  in 12 months.   Lab Review    Chemistry      Component Value Date/Time   NA 134 (L) 12/10/2023 1554   K 4.0 12/10/2023 1554   CL 106 12/10/2023 1554   CO2 21 (L) 12/10/2023 1554   BUN 44 (H) 12/10/2023 1554   CREATININE 1.26 (H) 12/10/2023 1554      Component Value Date/Time   CALCIUM 9.3 12/10/2023 1554   ALKPHOS 66 12/10/2023 1554   AST 30 12/10/2023 1554   ALT 18  12/10/2023 1554   BILITOT 1.1 12/10/2023 1554      Lab Results  Component Value Date   WBC 8.9 09/05/2023   HGB 12.9 09/05/2023   HCT 37.8 09/05/2023   MCV 93.8 09/05/2023   PLT 134 (L) 09/05/2023    Assessment:  Caitlin House is a 79 y.o. female diagnosed with 5.9 cm unilocular left ovarian cyst on CT scan 09/05/23 for work up of left pelvic pain a week post colonoscopy.  Diagnosed with diverticulitis and symptoms resolved with antibiotics. Seen by Dr Lovetta 10/07/23 CA125 = 43.7, HE4 = 119, but elevated Creatinine raises HE4.  TVUS  in office showed left ovary contains a complex unilocular cyst. Cyst appears anechoic and contains a hyperechoic solid mass measuring 21 x 10 x 21mm, suspected internal papillary projection. Vascularity seen within papillary projection.  No prior abdominal imaging for comparison. Follow up US  ~5 cm left cyst appears to be a simple cyst; 6.1 cm fluid collection right adnexa. Stable US . Asymptomatic.  9/25 CA125 43.7, HE4 119. Asymptomatic.  Tumor markers mildly elevated, but this also could be due to diverticulitis and/or renal dysfunction.  ROMA 40%   Mild azotemia  Medical co-morbidities complicating care: hypertension, benign (01/03/2014), Gout (11/16/2013), Hyperlipemia (11/13/2013), and Migraines (11/13/2013), COPD.  Plan:   Problem List Items Addressed This Visit       Endocrine   Ovarian cyst - Primary    She has bilateral adnexal cystic structures on imaging. TVUS today stable and ORADS score 2, low risk of cancer.  Will Will repeat CA125 and HE4 today. In view of low risk of cancer and stability on US  will have her RTC  in 6 months with TVUS.   She had COPD. Uncertain how many flights of stairs she can walk up. She can walk around [place] without getting short of breath. If surgery is ever recommended she will need preoperative clearance.   The patient's diagnosis, an outline of the further diagnostic and laboratory studies which  will be required, the recommendation, and alternatives were discussed.  All questions were answered to the patient's satisfaction.   Prentice Agent, MD

## 2024-03-25 LAB — CA 125: Cancer Antigen (CA) 125: 32.8 U/mL (ref 0.0–38.1)

## 2024-03-26 LAB — HUMAN EPIDIDYMIS PROT 4,SERIAL: HE4: 137 pmol/L — ABNORMAL HIGH (ref 0.0–96.9)

## 2024-04-08 ENCOUNTER — Ambulatory Visit: Attending: Specialist

## 2024-04-13 ENCOUNTER — Ambulatory Visit
Admission: RE | Admit: 2024-04-13 | Discharge: 2024-04-13 | Disposition: A | Source: Ambulatory Visit | Attending: Specialist | Admitting: Specialist

## 2024-04-13 DIAGNOSIS — R918 Other nonspecific abnormal finding of lung field: Secondary | ICD-10-CM

## 2024-04-20 ENCOUNTER — Ambulatory Visit: Admitting: Podiatry

## 2024-09-21 ENCOUNTER — Ambulatory Visit

## 2024-09-29 ENCOUNTER — Inpatient Hospital Stay
